# Patient Record
Sex: Male | Born: 1950 | Race: White | Hispanic: No | Marital: Married | Smoking: Current every day smoker
Health system: Southern US, Community
[De-identification: ages and names within clinical notes are randomized; demographics above are authoritative.]

## PROBLEM LIST (undated history)

## (undated) DIAGNOSIS — F17200 Nicotine dependence, unspecified, uncomplicated: Secondary | ICD-10-CM

## (undated) DIAGNOSIS — H269 Unspecified cataract: Secondary | ICD-10-CM

## (undated) DIAGNOSIS — K635 Polyp of colon: Secondary | ICD-10-CM

## (undated) HISTORY — PX: TYMPANOSTOMY TUBE PLACEMENT: SHX32

## (undated) HISTORY — DX: Nicotine dependence, unspecified, uncomplicated: F17.200

## (undated) HISTORY — DX: Unspecified cataract: H26.9

## (undated) HISTORY — PX: TONSILLECTOMY: SHX5217

## (undated) HISTORY — DX: Polyp of colon: K63.5

---

## 2008-09-15 ENCOUNTER — Ambulatory Visit: Payer: Self-pay | Admitting: Family Medicine

## 2008-11-25 DIAGNOSIS — K635 Polyp of colon: Secondary | ICD-10-CM

## 2008-11-25 HISTORY — DX: Polyp of colon: K63.5

## 2008-11-25 HISTORY — PX: COLONOSCOPY: SHX174

## 2011-10-27 ENCOUNTER — Ambulatory Visit (INDEPENDENT_AMBULATORY_CARE_PROVIDER_SITE_OTHER): Payer: 59 | Admitting: Family Medicine

## 2011-10-27 ENCOUNTER — Ambulatory Visit
Admission: RE | Admit: 2011-10-27 | Discharge: 2011-10-27 | Disposition: A | Discharge status: Home / Self Care | Payer: 59 | Source: Ambulatory Visit | Attending: Family Medicine | Admitting: Family Medicine

## 2011-10-27 ENCOUNTER — Encounter: Payer: Self-pay | Admitting: Family Medicine

## 2011-10-27 VITALS — BP 140/70 | HR 68 | Ht 69.0 in | Wt 188.0 lb

## 2011-10-27 DIAGNOSIS — M545 Low back pain: Secondary | ICD-10-CM

## 2011-10-27 LAB — POCT URINALYSIS DIPSTICK
Blood, UA: NEGATIVE
Glucose, UA: NEGATIVE
Nitrite, UA: NEGATIVE
Protein, UA: NEGATIVE
Spec Grav, UA: <=1.005
Urobilinogen, UA: NEGATIVE

## 2011-10-27 NOTE — Progress Notes (Signed)
Chief complaint:  right sided LBP x a few days. Last week he was working outside and tripped over a root and injured his rib. This morning woke up and his backpain had worsened  HPI: Tripped on a tree root last week, and R lower ribs were sore, still occasionally twinges. Didn't have any back pain related to that fall.  Picked up his 63 year old ("solid") grandson about 3 days ago, and began having right sided back pain at that time.  Pain has continued, but it was much worse this morning, he could hardly get dressed.  Hasn't needed to take any meds for the pain, until pain got worse today.  Hasn't taken any meds today. Denies any radiation of pain, numbness, tingling or weakness.  Pain 1/10 when just at rest.  With certain movements, pain gets to 7-8/10.  Past Medical History  Diagnosis Date  . Smoker   . Colon polyps 11/2008    Dr. Helaine Chess adenoma.  repeat rec 3 years    Past Surgical History  Procedure Date  . Tonsillectomy child  . Tympanostomy tube placement child  . Colonoscopy 11/2008    Dr. Bosie Clos    History   Social History  . Marital Status: Married    Spouse Name: N/A    Number of Children: N/A  . Years of Education: N/A   Occupational History  . 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 Smoking status: Current Everyday Smoker -- 1.0 packs/day for 45 years  . Smokeless tobacco: Never Used  . Alcohol Use: Yes     1-2 beers every evening.  . Drug Use: No  . Sexually Active: Not on file   Other Topics Concern  . Not on file   Social History Narrative  . No narrative on file    Family History  Problem Relation Age of Onset  . Diabetes Mother   . Diabetes Father   . Cancer Neg Hx   . Heart disease Neg Hx     Current outpatient prescriptions:Cholecalciferol (VITAMIN D) 2000 UNITS tablet, Take 2,000 Units by mouth daily., Disp: , Rfl: ;  Multiple Vitamins-Minerals (MULTIVITAMIN WITH MINERALS) tablet, Take 1 tablet by mouth  daily., Disp: , Rfl:   No Known Allergies  ROS:  Denies fevers, URI symptoms.  Some mild allergies causing runny nose and occasional cough.  Denies shortness of breath.  Denies nausea, vomiting, diarrhea, abdominal pain, bowel changes, urinary problems. Denies skin rashes/lesions.  PHYSICAL EXAM: BP 140/70  Pulse 68  Ht 5\' 9"  (1.753 m)  Wt 188 lb (85.276 kg)  BMI 27.76 kg/m2 Well developed, pleasant, elderly male (appears older than stated age) in no distress Neck: no mass, no lymphadenopathy, spine nontender Spine--nontender.  No CVA tenderness.  No significant paraspinous muscle spasm. Tender at a focal spot on R inferiormost rib Negative straight leg raise.  Normal strength, sensation.  DTR's symmetric.  Normal gait. Skin without rashes  ASSESSMENT/PLAN:  1. LBP (low back pain)  POCT Urinalysis Dipstick, DG Chest 2 View, DG Ribs Unilateral Right   Back pain--focal area of pain at lower posterior rib (different area than injury last week).  Given that he is a smoker, will check CXR and rib detail to further evaluate area.   Declined Toradol shot.  Prefers treatment with OTC NSAIDs.  Recommended Aleve, up to 2 tabs BID, along with heat/ice.  F/u in 1 week if not better.   Schedule CPE with Dr. Lenord Carbo need  pneumovax. Check insurance for Zostavax coverage

## 2011-10-27 NOTE — Patient Instructions (Addendum)
Check insurance for shingles vaccine coverage (zostavax) prior to your physical appointment. Try and work on quitting smoking  Try heat and/or ice to area of pain.  I recommend using anti-inflammatories.  Since you have Aleve at home, I recommended using 1-2 pills twice daily for pain.  Take it with food.  Don't take ibuprofen or any other pain medications while using the Aleve (tylenol is okay, but no others).  Return in 1 week if pain isn't improving.  Go for your x-rays today to evaluate the ribs and chest

## 2014-09-23 ENCOUNTER — Encounter: Payer: Self-pay | Admitting: Family Medicine

## 2014-09-23 ENCOUNTER — Ambulatory Visit (INDEPENDENT_AMBULATORY_CARE_PROVIDER_SITE_OTHER): Payer: Medicare Other | Admitting: Family Medicine

## 2014-09-23 VITALS — BP 140/70 | HR 70 | Ht 69.0 in | Wt 169.0 lb

## 2014-09-23 DIAGNOSIS — E785 Hyperlipidemia, unspecified: Secondary | ICD-10-CM

## 2014-09-23 DIAGNOSIS — R0609 Other forms of dyspnea: Secondary | ICD-10-CM

## 2014-09-23 DIAGNOSIS — Z9849 Cataract extraction status, unspecified eye: Secondary | ICD-10-CM | POA: Insufficient documentation

## 2014-09-23 DIAGNOSIS — Z86010 Personal history of colonic polyps: Secondary | ICD-10-CM | POA: Diagnosis not present

## 2014-09-23 DIAGNOSIS — H269 Unspecified cataract: Secondary | ICD-10-CM | POA: Diagnosis not present

## 2014-09-23 DIAGNOSIS — Z720 Tobacco use: Secondary | ICD-10-CM

## 2014-09-23 DIAGNOSIS — F17200 Nicotine dependence, unspecified, uncomplicated: Secondary | ICD-10-CM | POA: Insufficient documentation

## 2014-09-23 LAB — COMPREHENSIVE METABOLIC PANEL
ALT: 14 U/L (ref 0–53)
AST: 24 U/L (ref 0–37)
Albumin: 4.1 g/dL (ref 3.5–5.2)
Alkaline Phosphatase: 58 U/L (ref 39–117)
BILIRUBIN TOTAL: 0.5 mg/dL (ref 0.2–1.2)
BUN: 13 mg/dL (ref 6–23)
CO2: 26 mEq/L (ref 19–32)
CREATININE: 0.76 mg/dL (ref 0.50–1.35)
Calcium: 9.2 mg/dL (ref 8.4–10.5)
Chloride: 96 mEq/L (ref 96–112)
Glucose, Bld: 92 mg/dL (ref 70–99)
Potassium: 4.6 mEq/L (ref 3.5–5.3)
SODIUM: 135 meq/L (ref 135–145)
Total Protein: 7.2 g/dL (ref 6.0–8.3)

## 2014-09-23 LAB — LIPID PANEL
CHOL/HDL RATIO: 2.3 ratio
Cholesterol: 203 mg/dL — ABNORMAL HIGH (ref 0–200)
HDL: 87 mg/dL (ref >=40)
LDL Cholesterol: 106 mg/dL — ABNORMAL HIGH (ref 0–99)
TRIGLYCERIDES: 50 mg/dL (ref <150)
VLDL: 10 mg/dL (ref 0–40)

## 2014-09-23 LAB — CBC WITH DIFFERENTIAL/PLATELET
BASOS ABS: 0.1 10*3/uL (ref 0.0–0.1)
Basophils Relative: 1 % (ref 0–1)
EOS ABS: 0.1 10*3/uL (ref 0.0–0.7)
EOS PCT: 1 % (ref 0–5)
HEMATOCRIT: 43.9 % (ref 39.0–52.0)
Hemoglobin: 14.8 g/dL (ref 13.0–17.0)
Lymphocytes Relative: 42 % (ref 12–46)
Lymphs Abs: 4.2 10*3/uL — ABNORMAL HIGH (ref 0.7–4.0)
MCH: 31.6 pg (ref 26.0–34.0)
MCHC: 33.7 g/dL (ref 30.0–36.0)
MCV: 93.8 fL (ref 78.0–100.0)
MONOS PCT: 9 % (ref 3–12)
MPV: 8.8 fL (ref 8.6–12.4)
Monocytes Absolute: 0.9 10*3/uL (ref 0.1–1.0)
NEUTROS ABS: 4.7 10*3/uL (ref 1.7–7.7)
Neutrophils Relative %: 47 % (ref 43–77)
Platelets: 233 10*3/uL (ref 150–400)
RBC: 4.68 MIL/uL (ref 4.22–5.81)
RDW: 14.1 % (ref 11.5–15.5)
WBC: 10 10*3/uL (ref 4.0–10.5)

## 2014-09-23 NOTE — Progress Notes (Signed)
   Subjective:    Patient ID: John AlamoBrian Rollins, male    DOB: 08/25/1950, 64 y.o.   MRN: [id number]  HPI He is here for an IPPE. His medical and social history was reviewed. He does smoke and has no intentions to quit. He keeps himself physically active helping take care of his grandchildren. He has no symptoms of depression. His functional ability is quite good. He does have a living will.he has had cataract surgery on the right and plans to have one on the left. He also has a previous history of colonic polyps and was given a letter to have this repeated.Initially he had no particular concerns or complaints however at the end of the interview he then mentioned a one-year history of dyspnea on exertion. He states that it has not gotten any worse in the last year. He then stated that it is intermittent in nature. He's had no chest pain, PND, peripheral edema.   Review of Systems     Objective:   Physical Exam Alert and in no distress. Tympanic membranes and canals are normal. Pharyngeal area is normal. Neck is supple without adenopathy or thyromegaly. Cardiac exam shows a regular sinus rhythm without murmurs or gallops. Lungs are clear to auscultation.abdominal exam shows no masses or tenderness with normal bowel sounds. EKG shows no acute changes.        Assessment & Plan:  Current smoker - Plan: CBC with Differential/Platelet, Comprehensive metabolic panel, Lipid panel  DOE (dyspnea on exertion) - Plan: EKG 12-Lead, CBC with Differential/Platelet, Comprehensive metabolic panel, Lipid panel  Cataract  History of colonic polyps he will set up an appointment to see his ophthalmologist and gastroenterologist. I will await further blood work and if normal schedule him back for pulmonary function testing reviewed at this time he is not interested in quitting smoking.

## 2014-09-23 NOTE — Patient Instructions (Signed)
Go ahead and set up your appointment to see the gastroenterologist and your ophthalmologist.

## 2015-03-16 ENCOUNTER — Encounter: Payer: Self-pay | Admitting: Family Medicine

## 2016-01-18 ENCOUNTER — Telehealth: Payer: Self-pay | Admitting: Family Medicine

## 2016-01-18 ENCOUNTER — Ambulatory Visit (INDEPENDENT_AMBULATORY_CARE_PROVIDER_SITE_OTHER): Payer: Medicare Other | Admitting: Family Medicine

## 2016-01-18 ENCOUNTER — Encounter: Payer: Self-pay | Admitting: Family Medicine

## 2016-01-18 ENCOUNTER — Ambulatory Visit
Admission: RE | Admit: 2016-01-18 | Discharge: 2016-01-18 | Disposition: A | Discharge status: Home / Self Care | Payer: Medicare Other | Source: Ambulatory Visit | Attending: Family Medicine | Admitting: Family Medicine

## 2016-01-18 VITALS — BP 130/80 | HR 78 | Temp 98.2°F | Ht 69.0 in | Wt 166.0 lb

## 2016-01-18 DIAGNOSIS — R0609 Other forms of dyspnea: Secondary | ICD-10-CM

## 2016-01-18 DIAGNOSIS — J209 Acute bronchitis, unspecified: Secondary | ICD-10-CM

## 2016-01-18 DIAGNOSIS — R058 Other specified cough: Secondary | ICD-10-CM

## 2016-01-18 DIAGNOSIS — Z720 Tobacco use: Secondary | ICD-10-CM

## 2016-01-18 DIAGNOSIS — R05 Cough: Secondary | ICD-10-CM

## 2016-01-18 DIAGNOSIS — F17200 Nicotine dependence, unspecified, uncomplicated: Secondary | ICD-10-CM

## 2016-01-18 MED ORDER — AMOXICILLIN-POT CLAVULANATE 875-125 MG PO TABS: 1.0000 | ORAL_TABLET | Freq: Two times a day (BID) | ORAL | 0 refills | Status: DC

## 2016-01-18 NOTE — Progress Notes (Signed)
   Subjective:    Patient ID: John Rollins, male    DOB: Sep 04, 1950, 64 y.o.   MRN: [id number]  HPI He complains of a two month history of increasing difficulty with productive cough and shortness of breath with the coughing. No fever, chills, chest pain, earache or sore throat. He does have underlying chronic rhinorrhea with PND and slight cough. He has a 50-pack-year smoking history and is not interested in quitting.   Review of Systems     Objective:   Physical Exam Alert and in no distress. Tympanic membranes and canals are normal. Pharyngeal area is normal. Neck is supple without adenopathy or thyromegaly. Cardiac exam shows a regular sinus rhythm without murmurs or gallops. Lungs show harsh breath sounds in the right lower base.      Assessment & Plan:  Current smoker - Plan: DG Chest 2 View  DOE (dyspnea on exertion) - Plan: DG Chest 2 View  Productive cough - Plan: DG Chest 2 View, amoxicillin-clavulanate (AUGMENTIN) 875-125 MG tablet  Acute bronchitis, unspecified organism Chest x-ray ordered. Also discussed possible follow-up limited chest CT. The x-ray shows no evidence of pneumonia. I will call in an antibiotic and recommend he follow-up with me in a month.

## 2016-03-22 ENCOUNTER — Ambulatory Visit (INDEPENDENT_AMBULATORY_CARE_PROVIDER_SITE_OTHER): Payer: Medicare Other | Admitting: Family Medicine

## 2016-03-22 ENCOUNTER — Encounter: Payer: Self-pay | Admitting: Family Medicine

## 2016-03-22 VITALS — BP 152/78 | HR 84 | Temp 98.0°F | Ht 69.0 in | Wt 170.2 lb

## 2016-03-22 DIAGNOSIS — F17200 Nicotine dependence, unspecified, uncomplicated: Secondary | ICD-10-CM

## 2016-03-22 DIAGNOSIS — Z720 Tobacco use: Secondary | ICD-10-CM

## 2016-03-22 DIAGNOSIS — J209 Acute bronchitis, unspecified: Secondary | ICD-10-CM

## 2016-03-22 MED ORDER — LEVOFLOXACIN 500 MG PO TABS: 500.0000 mg | ORAL_TABLET | Freq: Every day | ORAL | 0 refills | Status: DC

## 2016-03-22 NOTE — Progress Notes (Signed)
   Subjective:    Patient ID: John Rollins, male    DOB: 29-Apr-1951, 65 y.o.   MRN: [id number]  HPI He is here for consult concerning continued difficulty with cough and drainage. He states that the cough usually occurs after he notes some postnasal drainage causing him to cough and become more short of breath. He does however continue to smoke. No fever, chills, sore throat, earache, fatigue or malaise.   Review of Systems     Objective:   Physical Exam Alert and in no distress. Tympanic membranes and canals are normal. Pharyngeal area is normal. Neck is supple without adenopathy or thyromegaly. Cardiac exam shows a regular sinus rhythm without murmurs or gallops. Lungs are clear to auscultation. X-ray was reviewed.       Assessment & Plan:  Current smoker  Acute bronchitis, unspecified organism - Plan: levofloxacin (LEVAQUIN) 500 MG tablet Hard to say whether this is a bronchitis or sinusitis or combination thereof. I will treat him with Levaquin and have her return here 2 weeks for recheck and also PFT.

## 2016-04-06 ENCOUNTER — Ambulatory Visit (INDEPENDENT_AMBULATORY_CARE_PROVIDER_SITE_OTHER): Payer: Medicare Other | Admitting: Family Medicine

## 2016-04-06 ENCOUNTER — Encounter: Payer: Self-pay | Admitting: Family Medicine

## 2016-04-06 VITALS — BP 112/76 | HR 85 | Wt 169.0 lb

## 2016-04-06 DIAGNOSIS — Z122 Encounter for screening for malignant neoplasm of respiratory organs: Secondary | ICD-10-CM

## 2016-04-06 DIAGNOSIS — J449 Chronic obstructive pulmonary disease, unspecified: Secondary | ICD-10-CM | POA: Diagnosis not present

## 2016-04-06 DIAGNOSIS — F17200 Nicotine dependence, unspecified, uncomplicated: Secondary | ICD-10-CM | POA: Diagnosis not present

## 2016-04-06 NOTE — Progress Notes (Signed)
   Subjective:    Patient ID: Sonnie AlamoBrian Frommer, male    DOB: [birth year], 65 y.o.   MRN: [id number]  HPI He is here for follow-up after recent bout of bronchitis. He now states that he is back to his normal state of health up only having occasional difficulty with coughing. He has a 50+ pack year history of smoking. He continues to smoke and at this point is not interested in quitting.Marland Kitchen. He has had no difficulty with weight change, or hemoptysis, previous history of lung cancer. He is willing to have further biopsies/surgery if needed.   Review of Systems     Objective:   Physical Exam Alert and in no distress. Lungs are clear to auscultation. Pulmonary function testing today showed very severe obstruction.       Assessment & Plan:  Smoker - Plan: Spirometry with graph  Current smoker  Chronic obstructive pulmonary disease, unspecified COPD type (HCC)  Screening for malignant neoplasm of respiratory organ I discussed the CT scan with him and the criteria for this. He is willing to go forward with this. We will order the scan and refer him to pulmonary since he does have her obstruction. I again stressed the need for him to quit smoking and again he at this point is not interested. I

## 2016-05-13 ENCOUNTER — Other Ambulatory Visit: Payer: Self-pay | Admitting: Family Medicine

## 2016-05-13 DIAGNOSIS — Z122 Encounter for screening for malignant neoplasm of respiratory organs: Secondary | ICD-10-CM

## 2016-05-13 DIAGNOSIS — F17200 Nicotine dependence, unspecified, uncomplicated: Secondary | ICD-10-CM

## 2016-05-17 ENCOUNTER — Ambulatory Visit
Admission: RE | Admit: 2016-05-17 | Discharge: 2016-05-17 | Disposition: A | Discharge status: Home / Self Care | Payer: Medicare Other | Source: Ambulatory Visit | Attending: Family Medicine | Admitting: Family Medicine

## 2016-05-17 DIAGNOSIS — Z122 Encounter for screening for malignant neoplasm of respiratory organs: Secondary | ICD-10-CM

## 2016-05-17 DIAGNOSIS — F17200 Nicotine dependence, unspecified, uncomplicated: Secondary | ICD-10-CM

## 2016-05-18 ENCOUNTER — Other Ambulatory Visit: Payer: Self-pay

## 2016-05-18 DIAGNOSIS — I2510 Atherosclerotic heart disease of native coronary artery without angina pectoris: Secondary | ICD-10-CM

## 2018-03-06 ENCOUNTER — Encounter: Payer: Self-pay | Admitting: Family Medicine

## 2018-03-06 ENCOUNTER — Ambulatory Visit: Payer: Medicare Other | Admitting: Family Medicine

## 2018-03-06 VITALS — BP 138/82 | HR 72 | Temp 97.6°F | Ht 69.0 in | Wt 172.8 lb

## 2018-03-06 DIAGNOSIS — F17200 Nicotine dependence, unspecified, uncomplicated: Secondary | ICD-10-CM

## 2018-03-06 DIAGNOSIS — Z0000 Encounter for general adult medical examination without abnormal findings: Secondary | ICD-10-CM | POA: Diagnosis not present

## 2018-03-06 DIAGNOSIS — Z13220 Encounter for screening for lipoid disorders: Secondary | ICD-10-CM

## 2018-03-06 DIAGNOSIS — R030 Elevated blood-pressure reading, without diagnosis of hypertension: Secondary | ICD-10-CM

## 2018-03-06 DIAGNOSIS — I700 Atherosclerosis of aorta: Secondary | ICD-10-CM

## 2018-03-06 DIAGNOSIS — Z860100 Personal history of colon polyps, unspecified: Secondary | ICD-10-CM

## 2018-03-06 DIAGNOSIS — Z136 Encounter for screening for cardiovascular disorders: Secondary | ICD-10-CM

## 2018-03-06 DIAGNOSIS — J439 Emphysema, unspecified: Secondary | ICD-10-CM | POA: Diagnosis not present

## 2018-03-06 DIAGNOSIS — Z1159 Encounter for screening for other viral diseases: Secondary | ICD-10-CM | POA: Diagnosis not present

## 2018-03-06 LAB — POCT URINALYSIS DIP (PROADVANTAGE DEVICE)
BILIRUBIN UA: NEGATIVE mg/dL
Bilirubin, UA: NEGATIVE
Glucose, UA: NEGATIVE mg/dL
Leukocytes, UA: NEGATIVE
Nitrite, UA: NEGATIVE
PROTEIN UA: NEGATIVE mg/dL
RBC UA: NEGATIVE
SPECIFIC GRAVITY, URINE: 1.01
UUROB: 3.5
pH, UA: 6 (ref 5.0–8.0)

## 2018-03-06 NOTE — Addendum Note (Signed)
Addended by: Renelda Loma on: 03/06/2018 11:18 AM   Modules accepted: Orders

## 2018-03-06 NOTE — Progress Notes (Signed)
John Rollins is a 67 y.o. male who presents for annual wellness visit and follow-up on chronic medical conditions.  He has no particular concerns or complaints.  He does continue to smoke and is not interested in quitting.  He did have a CT scan done in 2017 but is not interested in repeating that.  Scan did show evidence of emphysema as well as atherosclerosis.  He also has a history of colonic polyps but did not follow-up in the appropriate timeframe.  He has cut down on his alcohol consumption.  He is now drinking roughly 1 or 2/day.  He is retired and enjoying his retirement.  Spending time with his grandchildren.   Immunizations and Health Maintenance Immunization History  Administered Date(s) Administered  . Influenza-Unspecified 03/27/2014, 04/04/2016  . Tdap 06/28/2007   Health Maintenance Due  Topic Date Due  . Hepatitis C Screening  06-Nov-1950  . COLONOSCOPY  12/23/1998  . PNA vac Low Risk Adult (1 of 2 - PCV13) 12/22/2013  . TETANUS/TDAP  06/27/2017  . INFLUENZA VACCINE  01/25/2018    Last colonoscopy:over five years Last PSA: over one year Dentist: over two years Ophtho: three years ago Exercise: walking, yard work  Other doctors caring for patient include:none Advanced Directives: Not discussed    Depression screen:  See questionnaire below.     Depression screen [facility] 2/9 03/06/2018 01/18/2016 09/23/2014  Decreased Interest 0 0 0  Down, Depressed, Hopeless 0 0 0  PHQ - 2 Score 0 0 0    Fall Screen: See Questionaire below.   Fall Risk  03/06/2018 01/18/2016 09/23/2014  Falls in the past year? No No No    ADL screen:  See questionnaire below.  Functional Status Survey: Is the patient deaf or have difficulty hearing?: No Does the patient have difficulty seeing, even when wearing glasses/contacts?: No Does the patient have difficulty concentrating, remembering, or making decisions?: No Does the patient have difficulty walking or climbing stairs?: Yes(SOB on  stairs) Does the patient have difficulty dressing or bathing?: No Does the patient have difficulty doing errands alone such as visiting a doctor's office or shopping?: No   Review of Systems  Constitutional: -, -unexpected weight change, -anorexia, -fatigue Allergy: -sneezing, -itching, -congestion Dermatology: denies changing moles, rash, lumps ENT: -runny nose, -ear pain, -sore throat,  Cardiology:  -chest pain, -palpitations, -orthopnea, Respiratory: -cough, -shortness of breath, -dyspnea on exertion, -wheezing,  Gastroenterology: -abdominal pain, -nausea, -vomiting, -diarrhea, -constipation, -dysphagia Hematology: -bleeding or bruising problems Musculoskeletal: -arthralgias, -myalgias, -joint swelling, -back pain, - Ophthalmology: -vision changes,  Urology: -dysuria, -difficulty urinating,  -urinary frequency, -urgency, incontinence Neurology: -, -numbness, , -memory loss, -falls, -dizziness    PHYSICAL EXAM:   General Appearance: Alert, cooperative, no distress, appears stated age Head: Normocephalic, without obvious abnormality, atraumatic Eyes: PERRL, conjunctiva/corneas clear, EOM's intact, fundi benign Ears: Normal TM's and external ear canals Nose: Nares normal, mucosa normal, no drainage or sinus   tenderness Throat: Lips, mucosa, and tongue normal; teeth and gums normal Neck: Supple, no lymphadenopathy, thyroid:no enlargement/tenderness/nodules; no carotid bruit or JVD Lungs: Clear to auscultation bilaterally without wheezes, rales or ronchi; respirations unlabored Heart: Regular rate and rhythm, S1 and S2 normal, no murmur, rub or gallop Abdomen: Soft, non-tender, nondistended, normoactive bowel sounds, no masses, no hepatosplenomegaly Extremities: No clubbing, cyanosis or edema Pulses: 2+ and symmetric all extremities Skin: Skin color, texture, turgor normal, no rashes or lesions Lymph nodes: Cervical, supraclavicular, and axillary nodes normal Neurologic:  CNII-XII intact, normal strength, sensation and  gait; reflexes 2+ and symmetric throughout   Psych: Normal mood, affect, hygiene and grooming  ASSESSMENT/PLAN: Smoker - Plan: CBC with Differential/Platelet, Comprehensive metabolic panel  Pulmonary emphysema, unspecified emphysema type (HCC)  Atherosclerosis of aorta (HCC) - Plan: Lipid panel  Encounter for hepatitis C screening test for low risk patient - Plan: Hepatitis C antibody  Screening for lipid disorders - Plan: Lipid panel  History of colonic polyps - Plan: CBC with Differential/Platelet, Comprehensive metabolic panel, Ambulatory referral to Gastroenterology  Screening for AAA (abdominal aortic aneurysm) - Plan: US Abdomen Limited, CBC with Differential/Platelet, Comprehensive metabolic panel  Elevated blood pressure reading He is to return here in 2 months for repeat blood pressure check. Recommend getting Tdap, Shingrix and flu shot at the drugstore.   , recommended at least 30 minutes of aerobic activity at least 5 days/week;  healthy diet and alcohol recommendations (less than or equal to 2 drinks/day) reviewed; Marland Kitchen Immunization recommendations discussed.  Colonoscopy recommendations reviewed.   Medicare Attestation I have personally reviewed: The patient's medical and social history Their use of alcohol, tobacco or illicit drugs Their current medications and supplements The patient's functional ability including ADLs,fall risks, home safety risks, cognitive, and hearing and visual impairment Diet and physical activities Evidence for depression or mood disorders  The patient's weight, height, and BMI have been recorded in the chart.  I have made referrals, counseling, and provided education to the patient based on review of the above and I have provided the patient with a written personalized care plan for preventive services.     Sharlot Gowda, MD   03/06/2018

## 2018-03-06 NOTE — Addendum Note (Signed)
Addended by: Renelda Loma on: 03/06/2018 10:49 AM   Modules accepted: Orders

## 2018-03-07 LAB — CBC WITH DIFFERENTIAL/PLATELET
Basophils Absolute: 0.1 10*3/uL (ref 0.0–0.2)
Basos: 1 %
EOS (ABSOLUTE): 0.1 10*3/uL (ref 0.0–0.4)
EOS: 1 %
HEMATOCRIT: 46.2 % (ref 37.5–51.0)
HEMOGLOBIN: 15.7 g/dL (ref 13.0–17.7)
IMMATURE GRANS (ABS): 0 10*3/uL (ref 0.0–0.1)
Immature Granulocytes: 0 %
LYMPHS: 40 %
Lymphocytes Absolute: 3.2 10*3/uL — ABNORMAL HIGH (ref 0.7–3.1)
MCH: 32.1 pg (ref 26.6–33.0)
MCHC: 34 g/dL (ref 31.5–35.7)
MCV: 95 fL (ref 79–97)
Monocytes Absolute: 0.5 10*3/uL (ref 0.1–0.9)
Monocytes: 6 %
NEUTROS ABS: 4.2 10*3/uL (ref 1.4–7.0)
Neutrophils: 52 %
Platelets: 236 10*3/uL (ref 150–450)
RBC: 4.89 x10E6/uL (ref 4.14–5.80)
RDW: 14.1 % (ref 12.3–15.4)
WBC: 8.1 10*3/uL (ref 3.4–10.8)

## 2018-03-07 LAB — LIPID PANEL
CHOL/HDL RATIO: 2.4 ratio (ref 0.0–5.0)
Cholesterol, Total: 197 mg/dL (ref 100–199)
HDL: 82 mg/dL (ref >39)
LDL CALC: 92 mg/dL (ref 0–99)
Triglycerides: 115 mg/dL (ref 0–149)
VLDL CHOLESTEROL CAL: 23 mg/dL (ref 5–40)

## 2018-03-07 LAB — COMPREHENSIVE METABOLIC PANEL
ALBUMIN: 4.5 g/dL (ref 3.6–4.8)
ALT: 16 IU/L (ref 0–44)
AST: 24 IU/L (ref 0–40)
Albumin/Globulin Ratio: 1.6 (ref 1.2–2.2)
Alkaline Phosphatase: 68 IU/L (ref 39–117)
BUN / CREAT RATIO: 8 — AB (ref 10–24)
BUN: 7 mg/dL — AB (ref 8–27)
Bilirubin Total: 0.4 mg/dL (ref 0.0–1.2)
CO2: 24 mmol/L (ref 20–29)
CREATININE: 0.83 mg/dL (ref 0.76–1.27)
Calcium: 9.6 mg/dL (ref 8.6–10.2)
Chloride: 96 mmol/L (ref 96–106)
GFR, EST AFRICAN AMERICAN: 104 mL/min/{1.73_m2} (ref >59)
GFR, EST NON AFRICAN AMERICAN: 90 mL/min/{1.73_m2} (ref >59)
GLOBULIN, TOTAL: 2.8 g/dL (ref 1.5–4.5)
Glucose: 94 mg/dL (ref 65–99)
Potassium: 4.7 mmol/L (ref 3.5–5.2)
SODIUM: 134 mmol/L (ref 134–144)
TOTAL PROTEIN: 7.3 g/dL (ref 6.0–8.5)

## 2018-03-07 LAB — HEPATITIS C ANTIBODY: Hep C Virus Ab: <0.1 s/co ratio (ref 0.0–0.9)

## 2018-03-13 ENCOUNTER — Ambulatory Visit
Admission: RE | Admit: 2018-03-13 | Discharge: 2018-03-13 | Disposition: A | Discharge status: Home / Self Care | Payer: Medicare Other | Source: Ambulatory Visit | Attending: Family Medicine | Admitting: Family Medicine

## 2018-05-08 ENCOUNTER — Ambulatory Visit: Payer: Medicare Other | Admitting: Family Medicine

## 2018-05-09 ENCOUNTER — Encounter: Payer: Self-pay | Admitting: Family Medicine

## 2018-05-09 ENCOUNTER — Ambulatory Visit: Payer: Medicare Other | Admitting: Family Medicine

## 2018-05-09 VITALS — BP 122/80 | HR 86 | Temp 97.7°F | Wt 177.4 lb

## 2018-05-09 DIAGNOSIS — J439 Emphysema, unspecified: Secondary | ICD-10-CM | POA: Diagnosis not present

## 2018-05-09 DIAGNOSIS — R942 Abnormal results of pulmonary function studies: Secondary | ICD-10-CM

## 2018-05-09 DIAGNOSIS — Z91199 Patient's noncompliance with other medical treatment and regimen due to unspecified reason: Secondary | ICD-10-CM

## 2018-05-09 DIAGNOSIS — Z9119 Patient's noncompliance with other medical treatment and regimen: Secondary | ICD-10-CM | POA: Diagnosis not present

## 2018-05-09 DIAGNOSIS — F17200 Nicotine dependence, unspecified, uncomplicated: Secondary | ICD-10-CM

## 2018-05-09 NOTE — Progress Notes (Signed)
   Subjective:    Patient ID: John Rollins, male    DOB: Jul 07, 1950, 67 y.o.   MRN: [id number]  HPI He is here for recheck on his blood pressure.  He then mentioned the possibility of getting a handicap sticker.  He states that he sometimes has difficulty with becoming short of breath with any significant walking.  He continues to smoke and has no interest in quitting.   Review of Systems     Objective:   Physical Exam Alert and in no distress.  Blood pressure is normal.  Pulse ox on room air was adequate and with walking 94.  Spirometry did show severe airway obstruction.       Assessment & Plan:  Abnormal PFT - Plan: Ambulatory referral to Pulmonology  Pulmonary emphysema, unspecified emphysema type (HCC) - Plan: Spirometry with graph, CANCELED: Spirometry with graph  Smoker - Plan: Ambulatory referral to Pulmonology I explained that now we are actually seeing lung damage from his smoking I again discussed the need for him to quit smoking.  I explained that I will refer him to pulmonary to decide which medication would be the most appropriate place him on.

## 2018-06-07 ENCOUNTER — Encounter: Payer: Self-pay | Admitting: Pulmonary Disease

## 2018-06-07 ENCOUNTER — Ambulatory Visit (INDEPENDENT_AMBULATORY_CARE_PROVIDER_SITE_OTHER): Payer: Medicare Other | Admitting: Pulmonary Disease

## 2018-06-07 VITALS — BP 140/70 | HR 67 | Ht 67.0 in | Wt 177.8 lb

## 2018-06-07 DIAGNOSIS — J449 Chronic obstructive pulmonary disease, unspecified: Secondary | ICD-10-CM

## 2018-06-07 MED ORDER — TIOTROPIUM BROMIDE-OLODATEROL 2.5-2.5 MCG/ACT IN AERS: 2.0000 | INHALATION_SPRAY | Freq: Every day | RESPIRATORY_TRACT | 3 refills | Status: DC

## 2018-06-07 MED ORDER — ALBUTEROL SULFATE HFA 108 (90 BASE) MCG/ACT IN AERS: 2.0000 | INHALATION_SPRAY | RESPIRATORY_TRACT | 6 refills | Status: DC | PRN

## 2018-06-07 NOTE — Patient Instructions (Addendum)
#  Chronic obstructive pulmonary disease (COPD), uncontrolled --START Stiolto 2 puffs daily --START Albuterol inhaler 2 puffs every 4 hours as needed for shortness of breath or wheezing --ORDER full pulmonary function tests prior to next visit  #Tobacco abuse --STOP smoking --REFER to lung cancer screening clinic based on significant tobacco history

## 2018-06-07 NOTE — Progress Notes (Signed)
Synopsis: Referred in 05/2018 for COPD  Subjective:   PATIENT ID: John Rollins, John Rollins   HPI  Chief Complaint  Patient presents with  . consult    SOB worsening over past 5 years.  Prod cough on and off - yellow to white   [name] is a 67 year old male current smoker with severe COPD who presents as a new consult for shortness of breath.  He reports gradually worsening history of shortness of breath the last 5 years.  He has a significant smoking history of greater than 50 to 60 pack years and still smokes 1 pack/day.  Shortness of breath is associated with productive cough with clear/yellow sputum.  Occasional wheezing.  He has symptoms with walking to the mailbox and going up a flight of stairs.  He has not tried anything to improve his symptoms except rest.  Denies fevers, chills, weight loss, night sweats.  Denies chest pain, palpitations.  Environmental exposures: [place], exposure to agent orange  I have personally reviewed patient's past medical/family/social history, allergies, current medications.  Past Medical History:  Diagnosis Date  . Cataract   . Colon polyps 11/2008   Dr. Helaine ChessSchooler--tubular adenoma.  repeat rec 3 years  . Smoker      Family History  Problem Relation Age of Onset  . Diabetes Mother   . Diabetes Father   . Cancer Neg Hx   . Heart disease Neg Hx      Social History   Occupational History  . 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 Smoking status: Current Every Day Smoker    Packs/day: 1.00    Years: 45.00    Pack years: 45.00  . Smokeless tobacco: Never Used  Substance and Sexual Activity  . Alcohol use: Yes    Comment: 1-2 beers every evening.  . Drug use: No  . Sexual activity: Yes    No Known Allergies   No outpatient medications prior to visit.   No facility-administered medications prior to visit.     Review of Systems    Constitutional: Positive for fever and malaise/fatigue. Negative for chills, diaphoresis and weight loss.  HENT: Positive for congestion. Negative for sinus pain and sore throat.   Respiratory: Positive for cough, sputum production, shortness of breath and wheezing. Negative for hemoptysis.   Cardiovascular: Negative for chest pain, palpitations, leg swelling and PND.  Gastrointestinal: Negative for heartburn.  Musculoskeletal: Negative for myalgias.  Skin: Negative for rash.  Neurological: Negative for dizziness and weakness.  Endo/Heme/Allergies: Negative for environmental allergies.     Objective:  Physical Exam   Vitals:   06/07/18 1017  BP: 140/70  Pulse: 67  SpO2: 97%  Weight: 177 lb 12.8 oz (80.6 kg)  Height: 5\' 7"  (1.702 m)   SpO2: 97 % O2 Device: None (Room air)  Physical Exam: General: Well-appearing, no acute distress HENT: [place], AT Eyes: EOMI, no scleral icterus Respiratory: Decreased breath sounds bilaterally clear to auscultation bilaterally.  No crackles, wheezing or rales Cardiovascular: RRR, -M/R/G, no JVD GI: BS+, soft, nontender Extremities:-Edema,-tenderness Neuro: AAO x4, CNII-XII grossly intact Skin: Intact, no rashes or bruising Psych: Normal mood, normal affect   Chest imaging:  CT chest lung screen 05/17/2016-emphysema, scattered pulmonary nodules bilaterally less than 6 mm  PFT 05/09/2018  FVC 2.1 (48%) FEV1 0.9 (30%) FEV1/FVC 46%  I have personally reviewed the above labs, images and tests noted above.  Assessment & Plan:   Discussion: 67 year old male with significant tobacco history use (60 pack years) who presents for management of COPD.  #Chronic obstructive pulmonary disease (COPD), uncontrolled Gold class B. No exacerbations in the last 12 months. --START Stiolto 2 puffs daily --START Albuterol inhaler 2 puffs every 4 hours as needed for shortness of breath or wheezing --ORDER full pulmonary function tests prior to next  visit  #Tobacco abuse Patient is an active smoker.  Last CT lung screen on 04/2016. --We discussed smoking cessation for 5 minutes. We discussed triggers and stressors and ways to deal with them. We discussed barriers to continued smoking and benefits of smoking cessation. Provided patient with information cessation techniques and interventions including [place] quitline. --REFER to lung cancer screening clinic based on significant tobacco history  Orders Placed This Encounter  Procedures  . Ambulatory Referral for Lung Cancer Scre    Referral Priority:   Routine    Referral Type:   Consultation    Referral Reason:   Specialty Services Required    Number of Visits Requested:   1  . Pulmonary function test    Standing Status:   Future    Standing Expiration Date:   06/08/2019    Scheduling Instructions:     pft with next OV in 1 month    Order Specific Question:   Where should this test be performed?    Answer:   [facility]    Order Specific Question:   Full PFT: includes the following: basic spirometry, spirometry pre & post bronchodilator, diffusion capacity (DLCO), lung volumes    Answer:   Full PFT   Meds ordered this encounter  Medications  . Tiotropium Bromide-Olodaterol (STIOLTO RESPIMAT) 2.5-2.5 MCG/ACT AERS    Sig: Inhale 2 puffs into the lungs daily.    Dispense:  1 Inhaler    Refill:  3  . albuterol (PROVENTIL HFA;VENTOLIN HFA) 108 (90 Base) MCG/ACT inhaler    Sig: Inhale 2 puffs into the lungs every 4 (four) hours as needed for wheezing or shortness of breath.    Dispense:  1 Inhaler    Refill:  6    Return in about 1 month (around 07/08/2018).   Thank you for choosing [facility] for your health needs!   Ferdie Bakken Mechele Collin, MD [facility] 06/07/2018 9:34 PM  Personal pager: [phone] If unanswered, please page [facility] On-call: #[phone]

## 2018-06-11 ENCOUNTER — Other Ambulatory Visit: Payer: Self-pay | Admitting: Acute Care

## 2018-06-11 DIAGNOSIS — Z122 Encounter for screening for malignant neoplasm of respiratory organs: Secondary | ICD-10-CM

## 2018-06-11 DIAGNOSIS — Z87891 Personal history of nicotine dependence: Secondary | ICD-10-CM

## 2018-06-11 DIAGNOSIS — F17210 Nicotine dependence, cigarettes, uncomplicated: Principal | ICD-10-CM

## 2018-07-03 NOTE — Progress Notes (Signed)
Shared Decision Making Visit Lung Cancer Screening Program [phone])   Eligibility:  Age 68 y.o.  Pack Years Smoking History Calculation 82.5 pack year smoking history (# packs/per year x # years smoked)  Recent History of coughing up blood  no  Unexplained weight loss? no ( >Than 15 pounds within the last 6 months )  Prior History Lung / other cancer no (Diagnosis within the last 5 years already requiring surveillance chest CT Scans).  Smoking Status Current Smoker  Former Smokers: Years since quit: NA  Quit Date: NA  Visit Components:  Discussion included one or more decision making aids. yes  Discussion included risk/benefits of screening. yes  Discussion included potential follow up diagnostic testing for abnormal scans. yes  Discussion included meaning and risk of over diagnosis. yes  Discussion included meaning and risk of False Positives. yes  Discussion included meaning of total radiation exposure. yes  Counseling Included:  Importance of adherence to annual lung cancer LDCT screening. yes  Impact of comorbidities on ability to participate in the program. yes  Ability and willingness to under diagnostic treatment. yes  Smoking Cessation Counseling:  Current Smokers:   Discussed importance of smoking cessation. yes  Information about tobacco cessation classes and interventions provided to patient. yes  Patient provided with "ticket" for LDCT Scan. yes  Symptomatic Patient. no  Counseling  Diagnosis Code: Tobacco Use Z72.0  Asymptomatic Patient yes  Counseling (Intermediate counseling: > three minutes counseling) Y7829  Former Smokers:   Discussed the importance of maintaining cigarette abstinence. yes  Diagnosis Code: Personal History of Nicotine Dependence. F62.130  Information about tobacco cessation classes and interventions provided to patient. Yes  Patient provided with "ticket" for LDCT Scan. yes  Written Order for Lung Cancer  Screening with LDCT placed in Epic. Yes (CT Chest Lung Cancer Screening Low Dose W/O CM) [id number] Z12.2-Screening of respiratory organs Z87.891-Personal history of nicotine dependence  I have spent 25 minutes of face to face time with Mr. Skov discussing the risks and benefits of lung cancer screening. We viewed a power point together that explained in detail the above noted topics. We paused at intervals to allow for questions to be asked and answered to ensure understanding.We discussed that the single most powerful action that he can take to decrease his risk of developing lung cancer is to quit smoking. We discussed whether or not he is ready to commit to setting a quit date. We discussed options for tools to aid in quitting smoking including nicotine replacement therapy, non-nicotine medications, support groups, Quit Smart classes, and behavior modification. We discussed that often times setting smaller, more achievable goals, such as eliminating 1 cigarette a day for a week and then 2 cigarettes a day for a week can be helpful in slowly decreasing the number of cigarettes smoked. This allows for a sense of accomplishment as well as providing a clinical benefit. I gave him the " Be Stronger Than Your Excuses" card with contact information for community resources, classes, free nicotine replacement therapy, and access to mobile apps, text messaging, and on-line smoking cessation help. I have also given him my card and contact information in the event he needs to contact me. We discussed the time and location of the scan, and that either Abigail Miyamoto RN or I will call with the results within 24-48 hours of receiving them. I have offered him  a copy of the power point we viewed  as a resource in the event they need reinforcement of  the concepts we discussed today in the office. The patient verbalized understanding of all of  the above and had no further questions upon leaving the office. They have my contact  information in the event they have any further questions.  I spent 4 minutes counseling on smoking cessation and the health risks of continued tobacco abuse.  I explained to the patient that there has been a high incidence of coronary artery disease noted on these exams. I explained that this is a non-gated exam therefore degree or severity cannot be determined. This patient is not currently on statin therapy. I have asked the patient to follow-up with their PCP regarding any incidental finding of coronary artery disease and management with diet or medication as their PCP  feels is clinically indicated. The patient verbalized understanding of the above and had no further questions upon completion of the visit.      Bevelyn NgoSarah F , NP 07/04/2018 12:40 PM

## 2018-07-04 ENCOUNTER — Encounter: Payer: Self-pay | Admitting: Acute Care

## 2018-07-04 ENCOUNTER — Ambulatory Visit (INDEPENDENT_AMBULATORY_CARE_PROVIDER_SITE_OTHER)
Admission: RE | Admit: 2018-07-04 | Discharge: 2018-07-04 | Disposition: A | Discharge status: Home / Self Care | Payer: Medicare Other | Source: Ambulatory Visit | Attending: Acute Care | Admitting: Acute Care

## 2018-07-04 ENCOUNTER — Ambulatory Visit (INDEPENDENT_AMBULATORY_CARE_PROVIDER_SITE_OTHER): Payer: Medicare Other | Admitting: Acute Care

## 2018-07-04 VITALS — BP 168/80 | HR 74 | Ht 67.0 in | Wt 185.0 lb

## 2018-07-04 DIAGNOSIS — F17210 Nicotine dependence, cigarettes, uncomplicated: Secondary | ICD-10-CM

## 2018-07-04 DIAGNOSIS — Z122 Encounter for screening for malignant neoplasm of respiratory organs: Secondary | ICD-10-CM

## 2018-07-04 DIAGNOSIS — Z87891 Personal history of nicotine dependence: Secondary | ICD-10-CM | POA: Diagnosis not present

## 2018-07-06 ENCOUNTER — Other Ambulatory Visit: Payer: Self-pay | Admitting: Acute Care

## 2018-07-06 DIAGNOSIS — Z87891 Personal history of nicotine dependence: Secondary | ICD-10-CM

## 2018-07-06 DIAGNOSIS — F17210 Nicotine dependence, cigarettes, uncomplicated: Principal | ICD-10-CM

## 2018-07-06 DIAGNOSIS — Z122 Encounter for screening for malignant neoplasm of respiratory organs: Secondary | ICD-10-CM

## 2018-07-12 ENCOUNTER — Ambulatory Visit (INDEPENDENT_AMBULATORY_CARE_PROVIDER_SITE_OTHER): Payer: Medicare Other | Admitting: Pulmonary Disease

## 2018-07-12 ENCOUNTER — Ambulatory Visit: Payer: Medicare Other | Admitting: Pulmonary Disease

## 2018-07-12 ENCOUNTER — Encounter: Payer: Self-pay | Admitting: Pulmonary Disease

## 2018-07-12 VITALS — BP 126/64 | HR 64 | Ht 67.0 in | Wt 185.8 lb

## 2018-07-12 DIAGNOSIS — J449 Chronic obstructive pulmonary disease, unspecified: Secondary | ICD-10-CM

## 2018-07-12 LAB — PULMONARY FUNCTION TEST
DL/VA % pred: 46 %
DL/VA: 2.07 ml/min/mmHg/L
DLCO UNC: 10.68 ml/min/mmHg
DLCO unc % pred: 37 %
FEF 25-75 PRE: 0.56 L/s
FEF 25-75 Post: 0.74 L/sec
FEF2575-%CHANGE-POST: 33 %
FEF2575-%PRED-POST: 32 %
FEF2575-%Pred-Pre: 24 %
FEV1-%CHANGE-POST: 11 %
FEV1-%PRED-POST: 51 %
FEV1-%Pred-Pre: 46 %
FEV1-POST: 1.5 L
FEV1-Pre: 1.35 L
FEV1FVC-%Change-Post: 6 %
FEV1FVC-%Pred-Pre: 59 %
FEV6-%CHANGE-POST: 5 %
FEV6-%PRED-POST: 83 %
FEV6-%PRED-PRE: 78 %
FEV6-PRE: 2.95 L
FEV6-Post: 3.12 L
FEV6FVC-%CHANGE-POST: 1 %
FEV6FVC-%PRED-PRE: 101 %
FEV6FVC-%Pred-Post: 103 %
FVC-%Change-Post: 4 %
FVC-%PRED-POST: 80 %
FVC-%Pred-Pre: 77 %
FVC-Post: 3.21 L
FVC-Pre: 3.08 L
POST FEV1/FVC RATIO: 47 %
POST FEV6/FVC RATIO: 97 %
PRE FEV6/FVC RATIO: 96 %
Pre FEV1/FVC ratio: 44 %

## 2018-07-12 MED ORDER — TIOTROPIUM BROMIDE-OLODATEROL 2.5-2.5 MCG/ACT IN AERS: 2.0000 | INHALATION_SPRAY | Freq: Every day | RESPIRATORY_TRACT | 5 refills | Status: DC

## 2018-07-12 MED ORDER — TIOTROPIUM BROMIDE-OLODATEROL 2.5-2.5 MCG/ACT IN AERS: 2.0000 | INHALATION_SPRAY | Freq: Every day | RESPIRATORY_TRACT | 0 refills | Status: DC

## 2018-07-12 NOTE — Progress Notes (Signed)
PFT completed today.  

## 2018-07-12 NOTE — Patient Instructions (Signed)
COPD Gold Class B --REFILLED Stiolto 2 puffs daily --CONTINUE Albuterol inhaler 2 puffs every 4 hours as needed for shortness of breath or wheezing  Follow-up 6 months

## 2018-07-12 NOTE — Progress Notes (Signed)
Synopsis: Referred in 05/2018 for COPD  Subjective:   PATIENT ID: John Rollins GENDER: male DOB: 12/30/1950, MRN: [id number]   HPI  Chief Complaint  Patient presents with  . Follow-up    feeling better - more active   [name] is a 68 year old male current smoker with severe COPD who presents for follow-up of COPD.   Since our last visit on 06/08/19 when he was started on Stioloto, he reports his overall stamina and breathing has improved significantly. He previously could not walk to the mailbox located 100 yards from his house without stopping for breaks. Now he can go back and forth three times and is even challenging himself every day with more activity including walking uphill and tracking his steps with his new watch. Denies wheezing. Has minimal productive cough. Has not used his albuterol at all. Still smoking close to 1ppd but is considering quitting. Denies chest pain, palpitations or recent fevers or chills.  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  Past Medical History:  Diagnosis Date  . Cataract   . Colon polyps 11/2008   Dr. Helaine Chess adenoma.  repeat rec 3 years  . Smoker      Family History  Problem Relation Age of Onset  . Diabetes Mother   . Diabetes Father   . Cancer Neg Hx   . Heart disease Neg Hx      Social History   Occupational History  . 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 Smoking status: Current Every Day Smoker    Packs/day: 1.50    Years: 55.00    Pack years: 82.50  . Smokeless tobacco: Never Used  Substance and Sexual Activity  . Alcohol use: Yes    Comment: 1-2 beers every evening.  . Drug use: No  . Sexual activity: Yes    No Known Allergies   Outpatient Medications Prior to Visit  Medication Sig Dispense  Refill  . albuterol (PROVENTIL HFA;VENTOLIN HFA) 108 (90 Base) MCG/ACT inhaler Inhale 2 puffs into the lungs every 4 (four) hours as needed for wheezing or shortness of breath. 1 Inhaler 6  . Tiotropium Bromide-Olodaterol (STIOLTO RESPIMAT) 2.5-2.5 MCG/ACT AERS Inhale 2 puffs into the lungs daily. 1 Inhaler 3   No facility-administered medications prior to visit.     Review of Systems  Constitutional: Negative for chills, diaphoresis, fever, malaise/fatigue and weight loss.  HENT: Negative for congestion and sore throat.   Respiratory: Positive for shortness of breath. Negative for cough, hemoptysis, sputum production and wheezing.   Cardiovascular: Negative for chest pain, orthopnea, leg swelling and PND.  Gastrointestinal: Negative for abdominal pain, heartburn and nausea.  Genitourinary: Negative for frequency.  Musculoskeletal: Negative for myalgias.  Skin: Negative for rash.  Neurological: Negative for dizziness, weakness and headaches.  Endo/Heme/Allergies: Does not bruise/bleed easily.     Objective:    Vitals:   07/12/18 1328 07/12/18 1331  BP: 126/64   Pulse:  64  SpO2:  100%  Weight:  185 lb 12.8 oz (84.3 kg)  Height:  5\' 7"  (1.702 m)   SpO2: 100 % O2 Device: None (Room air)  Physical Exam: General: Well-appearing, no acute distress HENT: [place], AT, OP clear, MMM Eyes: EOMI, no scleral icterus Respiratory: Diminished breath sounds bilaterally. No crackles, wheezing or rales Cardiovascular: RRR, -M/R/G, no JVD GI: BS+, soft, nontender Extremities:-Edema,-tenderness Neuro:  AAO x4, CNII-XII grossly intact Skin: Intact, no rashes or bruising Psych: Normal mood, normal affect  Chest imaging:  CT chest lung screen 05/17/2016-emphysema, scattered pulmonary nodules bilaterally less than 6 mm  CT Chest Lung Screen 07/04/18 - no enlarging or suspicious nodules seen  PFT 05/09/2018-FEV1/FVC 46% FEV1 0.9 (30%) FVC 2.1 (48%)   PFT 07/12/18- FEV1/FVC 44% FEV1 1.5 (51%) FVC  3.21 (80%). Consistent with emphysema. Compared to last year's studies, significant improvement in FVC and FEV1 from very severe COPD to moderately COPD.  I have personally reviewed the above labs, images and tests noted above.    Assessment & Plan:   Discussion: 68 year old male with significant tobacco history use (60 pack years) who presents for management of COPD.  #Chronic obstructive pulmonary disease (COPD), uncontrolled Gold class B. No exacerbations in the last 12 months. --REFILLED Stiolto 2 puffs daily --CONTINUE Albuterol inhaler 2 puffs every 4 hours as needed for shortness of breath or wheezing  Tobacco abuse Patient is an active smoker. Last CT Lung Screen 06/2018 We discussed smoking cessation for 5 minutes. We again discussed triggers and stressors and ways to deal with them. We discussed barriers to continued smoking and benefits of smoking cessation. Provided patient with information cessation techniques and interventions including [place] quitline.   No orders of the defined types were placed in this encounter.  Meds ordered this encounter  Medications  . Tiotropium Bromide-Olodaterol (STIOLTO RESPIMAT) 2.5-2.5 MCG/ACT AERS    Sig: Inhale 2 puffs into the lungs daily.    Dispense:  1 Inhaler    Refill:  5  . Tiotropium Bromide-Olodaterol (STIOLTO RESPIMAT) 2.5-2.5 MCG/ACT AERS    Sig: Inhale 2 puffs into the lungs daily for 1 day.    Dispense:  1 Inhaler    Refill:  0    Order Specific Question:   Lot Number?    Answer:   [id number]    Order Specific Question:   Expiration Date?    Answer:   07/27/2019    Order Specific Question:   Quantity    Answer:   1    Return in about 6 months (around 01/10/2019).  Alnisa Hasley Mechele CollinJane Deontay Ladnier, MD [facility] 07/15/2018 3:20 PM  Personal pager: [phone] If unanswered, please page CCM On-call: [phone]

## 2019-01-10 ENCOUNTER — Telehealth: Payer: Self-pay | Admitting: Pulmonary Disease

## 2019-01-10 NOTE — Telephone Encounter (Signed)
There is a message in pt's appt tab in regards to recall that pt was called to get scheduled for an appt with Dr. Loanne Drilling. This is the reason for the call.    Called and spoke with pt letting him know that we were trying to see if we could get him scheduled for a follow up with Dr. Loanne Drilling and pt stated that was fine. Pt's covid screen was negative. Pt has been scheduled a follow up with Dr. Karma Greaser. 7/22 at 11:15. Nothing further needed.

## 2019-01-16 ENCOUNTER — Other Ambulatory Visit: Payer: Self-pay

## 2019-01-16 ENCOUNTER — Encounter: Payer: Self-pay | Admitting: Pulmonary Disease

## 2019-01-16 ENCOUNTER — Ambulatory Visit: Payer: Medicare Other | Admitting: Pulmonary Disease

## 2019-01-16 VITALS — BP 126/70 | HR 73 | Temp 98.1°F | Ht 69.0 in | Wt 184.2 lb

## 2019-01-16 DIAGNOSIS — J449 Chronic obstructive pulmonary disease, unspecified: Secondary | ICD-10-CM

## 2019-01-16 MED ORDER — STIOLTO RESPIMAT 2.5-2.5 MCG/ACT IN AERS: 2.0000 | INHALATION_SPRAY | Freq: Every day | RESPIRATORY_TRACT | 0 refills | Status: DC

## 2019-01-16 MED ORDER — STIOLTO RESPIMAT 2.5-2.5 MCG/ACT IN AERS: 2.0000 | INHALATION_SPRAY | Freq: Every day | RESPIRATORY_TRACT | 12 refills | Status: DC

## 2019-01-16 NOTE — Patient Instructions (Addendum)
COPD --Refilled Stioloto 2 puffs daily --CONTINUE Albuterol inhaler 2 puffs every 4 hours as needed for shortness of breath or wheezing  Follow-up in 6 months

## 2019-01-16 NOTE — Progress Notes (Signed)
Synopsis: Referred in 05/2018 for COPD  Subjective:   PATIENT ID: John Rollins GENDER: male DOB: [birth year], MRN: [id number]   HPI  Chief Complaint  Patient presents with  . Follow-up   [name] is a 68 year old male active smoker with severe COPD who presents for follow-up of COPD.  Overall he reports that his breathing is stable and has had no issues.  Since her last visit he has not had any outpatient exacerbations.  Denies ED/hospitalizations related to COPD.  Has not required prednisone.  He is compliant with his Stiolto and has not had to use his rescue inhaler.  He is able to perform activities of daily living including ambulating without the house and walking uphill.  He tries to remain active and monitors his steps with his watch.  Has occasional productive cough otherwise denies wheezing.  He has tried quitting however during the pandemic he reports continue to smoke about 1 pack/day.  Denies recent fevers, chills, chest pain.  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  Past Medical History:  Diagnosis Date  . Cataract   . Colon polyps 11/2008   Dr. Ike Bene adenoma.  repeat rec 3 years  . Smoker      Family History  Problem Relation Age of Onset  . Diabetes Mother   . Diabetes Father   . Cancer Neg Hx   . Heart disease Neg Hx      Social History   Occupational History  . 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 Smoking status: Current Every Day Smoker    Packs/day: 1.50    Years: 55.00    Pack years: 82.50  . Smokeless tobacco: Never Used  Substance and Sexual Activity  . Alcohol use: Yes    Comment: 1-2 beers every evening.  . Drug use: No  . Sexual activity: Yes    No Known Allergies   Outpatient Medications Prior to Visit   Medication Sig Dispense Refill  . albuterol (PROVENTIL HFA;VENTOLIN HFA) 108 (90 Base) MCG/ACT inhaler Inhale 2 puffs into the lungs every 4 (four) hours as needed for wheezing or shortness of breath. 1 Inhaler 6  . Tiotropium Bromide-Olodaterol (STIOLTO RESPIMAT) 2.5-2.5 MCG/ACT AERS Inhale 2 puffs into the lungs daily. 1 Inhaler 5   No facility-administered medications prior to visit.     Review of Systems  Constitutional: Negative for chills, diaphoresis, fever, malaise/fatigue and weight loss.  HENT: Negative for congestion, ear pain and sore throat.   Respiratory: Positive for cough. Negative for hemoptysis, sputum production, shortness of breath and wheezing.   Cardiovascular: Negative for chest pain, palpitations and leg swelling.  Gastrointestinal: Negative for abdominal pain, heartburn and nausea.  Genitourinary: Negative for frequency.  Musculoskeletal: Negative for joint pain and myalgias.  Skin: Negative for itching and rash.  Neurological: Negative for dizziness, weakness and headaches.  Endo/Heme/Allergies: Does not bruise/bleed easily.  Psychiatric/Behavioral: Negative for depression. The patient is not nervous/anxious.    Objective:    Vitals:   01/16/19 1136  BP: 126/70  Pulse: 73  Temp: 98.1 F (36.7 C)  TempSrc: Oral  SpO2: 94%  Weight: 184 lb 3.2 oz (83.6 kg)  Height: 5\' 9"  (1.753 m)   SpO2: 94 %   Physical Exam: General: Well-appearing, no acute distress HENT: [place], AT Eyes: EOMI, no scleral icterus Respiratory: Clear to auscultation bilaterally.  No crackles, wheezing or rales Cardiovascular: RRR, -M/R/G, no JVD GI: BS+, soft, nontender Extremities:-Edema,-tenderness Neuro: AAO x4, CNII-XII grossly intact Skin: Intact, no rashes or bruising Psych: Normal mood, normal affect  Data Reviewed:  Chest imaging CT chest lung screen 05/17/2016-emphysema, scattered pulmonary nodules bilaterally less than 6 mm CT Chest Lung Screen 07/04/18 - no enlarging  or suspicious nodules seen  PFT 05/09/2018-FEV1/FVC 46% FEV1 0.9 (30%) FVC 2.1 (48%)   07/12/18- FEV1/FVC 44% FEV1 1.5 (51%) FVC 3.21 (80%). Consistent with emphysema. Compared to last year's studies, significant improvement in FVC and FEV1 from very severe COPD to moderately COPD.  Lab CBC and CMP 03/07/19 reviewed. Hg 15.7 CO2 24  Labs, images and studies reviewed personally by me.    Assessment & Plan:   Discussion: [id number] year old male active smoker with significant tobacco use (60 pack years) presents for follow-up of COPD  COPD Gold Class B, well controlled --Refilled Stioloto 2 puffs daily --CONTINUE Albuterol inhaler 2 puffs every 4 hours as needed for shortness of breath or wheezing  Tobacco abuse Patient is an active smoker. We discussed smoking cessation for <3 minutes. We discussed triggers and stressors and ways to deal with them. We discussed barriers to continued smoking and benefits of smoking cessation.  --Due for CT lung screen in 06/2019  No orders of the defined types were placed in this encounter.  Meds ordered this encounter  Medications  . Tiotropium Bromide-Olodaterol (STIOLTO RESPIMAT) 2.5-2.5 MCG/ACT AERS    Sig: Inhale 2 puffs into the lungs daily.    Dispense:  4 g    Refill:  12  . Tiotropium Bromide-Olodaterol (STIOLTO RESPIMAT) 2.5-2.5 MCG/ACT AERS    Sig: Inhale 2 puffs into the lungs daily.    Dispense:  4 g    Refill:  0    Return in about 6 months (around 07/19/2019).  Latysha Thackston Mechele CollinJane Leupp Jon, MD [facility] 01/19/2019 1:40 PM  Personal pager: [phone] If unanswered, please page CCM On-call: [phone]

## 2019-01-19 ENCOUNTER — Encounter: Payer: Self-pay | Admitting: Pulmonary Disease

## 2019-06-18 ENCOUNTER — Other Ambulatory Visit: Payer: Self-pay

## 2019-06-18 ENCOUNTER — Encounter: Payer: Self-pay | Admitting: Family Medicine

## 2019-06-18 ENCOUNTER — Ambulatory Visit: Payer: Medicare Other | Admitting: Family Medicine

## 2019-06-18 VITALS — BP 154/82 | HR 83 | Temp 97.5°F | Ht 69.0 in | Wt 184.6 lb

## 2019-06-18 DIAGNOSIS — Z23 Encounter for immunization: Secondary | ICD-10-CM | POA: Diagnosis not present

## 2019-06-18 DIAGNOSIS — Z860100 Personal history of colon polyps, unspecified: Secondary | ICD-10-CM

## 2019-06-18 DIAGNOSIS — Z9849 Cataract extraction status, unspecified eye: Secondary | ICD-10-CM | POA: Diagnosis not present

## 2019-06-18 DIAGNOSIS — I700 Atherosclerosis of aorta: Secondary | ICD-10-CM

## 2019-06-18 DIAGNOSIS — Z1211 Encounter for screening for malignant neoplasm of colon: Secondary | ICD-10-CM

## 2019-06-18 DIAGNOSIS — R030 Elevated blood-pressure reading, without diagnosis of hypertension: Secondary | ICD-10-CM

## 2019-06-18 DIAGNOSIS — Z0000 Encounter for general adult medical examination without abnormal findings: Secondary | ICD-10-CM

## 2019-06-18 DIAGNOSIS — J439 Emphysema, unspecified: Secondary | ICD-10-CM | POA: Diagnosis not present

## 2019-06-18 DIAGNOSIS — F17200 Nicotine dependence, unspecified, uncomplicated: Secondary | ICD-10-CM

## 2019-06-18 DIAGNOSIS — H9113 Presbycusis, bilateral: Secondary | ICD-10-CM

## 2019-06-18 LAB — POCT URINALYSIS DIP (PROADVANTAGE DEVICE)
Bilirubin, UA: NEGATIVE
Blood, UA: NEGATIVE
Glucose, UA: NEGATIVE mg/dL
Ketones, POC UA: NEGATIVE mg/dL
Leukocytes, UA: NEGATIVE
Nitrite, UA: NEGATIVE
Protein Ur, POC: NEGATIVE mg/dL
Specific Gravity, Urine: 1.01
Urobilinogen, Ur: 0.2
pH, UA: 6 (ref 5.0–8.0)

## 2019-06-18 NOTE — Progress Notes (Signed)
John Rollins is a 68 y.o. male who presents for annual wellness visit and follow-up on chronic medical conditions.  He has severe COPD and continues on his inhalers.  He also continues to smoke and has no desire to quit.  He does use his rescue inhaler fairly frequently.  He does have hearing aids which he says does help.  He also has a history of colonic polyps but is not followed up appropriately.  He does have bilateral hearing aids which she says does help.  He does have x-ray evidence of atherosclerosis.  He does not complain of chest pain, PND or DOE.  Social and family history was reviewed.   Immunizations and Health Maintenance Immunization History  Administered Date(s) Administered  . Fluad Quad(high Dose 65+) 06/18/2019  . Influenza-Unspecified 03/27/2014, 04/04/2016  . Pneumococcal Conjugate-13 06/18/2019  . Tdap 06/28/2007   Health Maintenance Due  Topic Date Due  . TETANUS/TDAP  06/27/2017  . COLONOSCOPY  11/28/2018    Last colonoscopy: Over 10 years ago Last PSA: never Dentist: 15 years ago Ophtho:two years ago Exercise:walking , yard work  Other doctors caring for patient include: Dr. Everardo [facility], [facility]  Advanced Directives: not on file.  Copy asked for. Does Patient Have a Medical Advance Directive?: Yes Type of Advance Directive: Healthcare Power of Attorney, Living will Does patient want to make changes to medical advance directive?: No - Patient declined Copy of Healthcare Power of Attorney in Chart?: No - copy requested  Depression screen:  See questionnaire below.     Depression screen [facility] 2/9 06/18/2019 03/06/2018 01/18/2016 09/23/2014  Decreased Interest 0 0 0 0  Down, Depressed, Hopeless 0 0 0 0  PHQ - 2 Score 0 0 0 0    Fall Screen: See Questionaire below.   Fall Risk  06/18/2019 03/06/2018 01/18/2016 09/23/2014  Falls in the past year? 0 No No No    ADL screen:  See questionnaire below.  Functional Status Survey: Is the patient  deaf or have difficulty hearing?: No(hearing aide) Does the patient have difficulty seeing, even when wearing glasses/contacts?: No Does the patient have difficulty concentrating, remembering, or making decisions?: No Does the patient have difficulty walking or climbing stairs?: No Does the patient have difficulty dressing or bathing?: No Does the patient have difficulty doing errands alone such as visiting a doctor's office or shopping?: No   Review of Systems  Constitutional: -, -unexpected weight change, -anorexia, -fatigue Allergy: -sneezing, -itching, -congestion Dermatology: denies changing moles, rash, lumps ENT: -runny nose, -ear pain, -sore throat,  Cardiology:  -chest pain, -palpitations, -orthopnea, Respiratory: -cough, -shortness of breath, -dyspnea on exertion, -wheezing,  Gastroenterology: -abdominal pain, -nausea, -vomiting, -diarrhea, -constipation, -dysphagia Hematology: -bleeding or bruising problems Musculoskeletal: -arthralgias, -myalgias, -joint swelling, -back pain, - Ophthalmology: -vision changes,  Urology: -dysuria, -difficulty urinating,  -urinary frequency, -urgency, incontinence Neurology: -, -numbness, , -memory loss, -falls, -dizziness    PHYSICAL EXAM:  BP (!) 154/82 (BP Location: Left Arm, Patient Position: Sitting)   Pulse 83   Temp (!) 97.5 F (36.4 C)   Ht 5\' 9"  (1.753 m)   Wt 184 lb 9.6 oz (83.7 kg)   SpO2 95%   BMI 27.26 kg/m   General Appearance: Alert, cooperative, no distress, appears stated age Head: Normocephalic, without obvious abnormality, atraumatic Eyes: PERRL, conjunctiva/corneas clear, EOM's intact,  Ears: Normal TM's and external ear canals Nose: Nares normal, mucosa normal, no drainage or sinus   tenderness Throat: Lips, mucosa, and tongue normal; teeth  and gums normal Neck: Supple, no lymphadenopathy, thyroid:no enlargement/tenderness/nodules; no carotid bruit or JVD Lungs: Clear to auscultation bilaterally without  wheezes, rales or ronchi; respirations unlabored Heart: Regular rate and rhythm, S1 and S2 normal, no murmur, rub or gallop Abdomen: Soft, non-tender, nondistended, normoactive bowel sounds, no masses, no hepatosplenomegaly Skin: Skin color, texture, turgor normal, no rashes or lesions Lymph nodes: Cervical, supraclavicular, and axillary nodes normal Neurologic: CNII-XII intact, normal strength, sensation and gait; reflexes 2+ and symmetric throughout   Psych: Normal mood, affect, hygiene and grooming  ASSESSMENT/PLAN: Routine general medical examination at a health care facility - Plan: POCT Urinalysis DIP (Proadvantage Device)  Pulmonary emphysema, unspecified emphysema type ([place]) - Plan: CBC with Differential, Comprehensive metabolic panel  Smoker  History of colonic polyps - Plan: Ambulatory referral to Gastroenterology  History of cataract extraction, unspecified laterality  Elevated blood pressure reading  Atherosclerosis of aorta (HCC)  Presbycusis of both ears  Need for vaccination against Streptococcus pneumoniae - Plan: Pneumococcal conjugate vaccine 13-valent  Need for influenza vaccination - Plan: Flu Vaccine QUAD High Dose(Fluad)  Screening for colon cancer - Plan: Ambulatory referral to Gastroenterology  I have discussed smoking cessation with him with every visit and obviously he is not interested in quitting.  He will continue on his lung medications.  Do not feel the need to pursue the blood pressure at this time.  Did discuss his drinking history.   healthy diet and alcohol recommendations (less than or equal to 2 drinks/day) reviewed;Immunization recommendations discussed.  Colonoscopy recommendations reviewed.   Medicare Attestation I have personally reviewed: The patient's medical and social history Their use of alcohol, tobacco or illicit drugs Their current medications and supplements The patient's functional ability including ADLs,fall risks, home  safety risks, cognitive, and hearing and visual impairment Diet and physical activities Evidence for depression or mood disorders  The patient's weight, height, and BMI have been recorded in the chart.  I have made referrals, counseling, and provided education to the patient based on review of the above and I have provided the patient with a written personalized care plan for preventive services.     Jill Alexanders, MD   06/18/2019

## 2019-06-18 NOTE — Patient Instructions (Signed)
  Mr. Gardella , Thank you for taking time to come for your Medicare Wellness Visit. I appreciate your ongoing commitment to your health goals. Please review the following plan we discussed and let me know if I can assist you in the future.   These are the goals we discussed: Goals   None     This is a list of the screening recommended for you and due dates:  Health Maintenance  Topic Date Due  . Tetanus Vaccine  06/27/2017  . Colon Cancer Screening  11/28/2018  . Pneumonia vaccines (2 of 2 - PPSV23) 06/17/2020  . Flu Shot  Completed  .  Hepatitis C: One time screening is recommended by Center for Disease Control  (CDC) for  adults born from [birth year] through 1967.   Completed

## 2019-06-19 LAB — COMPREHENSIVE METABOLIC PANEL
ALT: 12 IU/L (ref 0–44)
AST: 20 IU/L (ref 0–40)
Albumin/Globulin Ratio: 1.4 (ref 1.2–2.2)
Albumin: 4.3 g/dL (ref 3.8–4.8)
Alkaline Phosphatase: 66 IU/L (ref 39–117)
BUN/Creatinine Ratio: 9 — ABNORMAL LOW (ref 10–24)
BUN: 7 mg/dL — ABNORMAL LOW (ref 8–27)
Bilirubin Total: 0.3 mg/dL (ref 0.0–1.2)
CO2: 23 mmol/L (ref 20–29)
Calcium: 9.4 mg/dL (ref 8.6–10.2)
Chloride: 98 mmol/L (ref 96–106)
Creatinine, Ser: 0.8 mg/dL (ref 0.76–1.27)
GFR calc Af Amer: 105 mL/min/{1.73_m2} (ref >59)
GFR calc non Af Amer: 91 mL/min/{1.73_m2} (ref >59)
Globulin, Total: 3.1 g/dL (ref 1.5–4.5)
Glucose: 97 mg/dL (ref 65–99)
Potassium: 4.8 mmol/L (ref 3.5–5.2)
Sodium: 131 mmol/L — ABNORMAL LOW (ref 134–144)
Total Protein: 7.4 g/dL (ref 6.0–8.5)

## 2019-06-19 LAB — CBC WITH DIFFERENTIAL/PLATELET
Basophils Absolute: 0.1 10*3/uL (ref 0.0–0.2)
Basos: 1 %
EOS (ABSOLUTE): 0.2 10*3/uL (ref 0.0–0.4)
Eos: 2 %
Hematocrit: 45.6 % (ref 37.5–51.0)
Hemoglobin: 15.7 g/dL (ref 13.0–17.7)
Immature Grans (Abs): 0 10*3/uL (ref 0.0–0.1)
Immature Granulocytes: 0 %
Lymphocytes Absolute: 4 10*3/uL — ABNORMAL HIGH (ref 0.7–3.1)
Lymphs: 48 %
MCH: 31.9 pg (ref 26.6–33.0)
MCHC: 34.4 g/dL (ref 31.5–35.7)
MCV: 93 fL (ref 79–97)
Monocytes Absolute: 0.7 10*3/uL (ref 0.1–0.9)
Monocytes: 8 %
Neutrophils Absolute: 3.4 10*3/uL (ref 1.4–7.0)
Neutrophils: 41 %
Platelets: 233 10*3/uL (ref 150–450)
RBC: 4.92 x10E6/uL (ref 4.14–5.80)
RDW: 13.2 % (ref 11.6–15.4)
WBC: 8.3 10*3/uL (ref 3.4–10.8)

## 2019-09-30 ENCOUNTER — Encounter: Payer: Self-pay | Admitting: Pulmonary Disease

## 2019-09-30 ENCOUNTER — Ambulatory Visit: Payer: Medicare Other | Admitting: Pulmonary Disease

## 2019-09-30 ENCOUNTER — Other Ambulatory Visit: Payer: Self-pay

## 2019-09-30 VITALS — BP 148/58 | HR 62 | Temp 97.4°F | Ht 70.0 in | Wt 183.0 lb

## 2019-09-30 DIAGNOSIS — J449 Chronic obstructive pulmonary disease, unspecified: Secondary | ICD-10-CM | POA: Diagnosis not present

## 2019-09-30 MED ORDER — STIOLTO RESPIMAT 2.5-2.5 MCG/ACT IN AERS: 2.0000 | INHALATION_SPRAY | Freq: Every day | RESPIRATORY_TRACT | 0 refills | Status: DC

## 2019-09-30 MED ORDER — ALBUTEROL SULFATE HFA 108 (90 BASE) MCG/ACT IN AERS: 2.0000 | INHALATION_SPRAY | RESPIRATORY_TRACT | 12 refills | Status: DC | PRN

## 2019-09-30 MED ORDER — STIOLTO RESPIMAT 2.5-2.5 MCG/ACT IN AERS: 2.0000 | INHALATION_SPRAY | Freq: Every day | RESPIRATORY_TRACT | 12 refills | Status: DC

## 2019-09-30 MED ORDER — ALBUTEROL SULFATE HFA 108 (90 BASE) MCG/ACT IN AERS: 2.0000 | INHALATION_SPRAY | RESPIRATORY_TRACT | 6 refills | Status: DC | PRN

## 2019-09-30 NOTE — Patient Instructions (Addendum)
69 year old male active smoker (60 pack-years) who presents for COPD follow-up. He is currently well-controlled on his current inhaler regimen. From a pulmonary standpoint, I recommend continuing bronchodilators as noted in plan below. Patient is also due for annual CT lung screening.  COPD Gold Class B, well-controlled --REFILL Stioloto 2 puffs daily --REFILL Albuterol inhaler 2 puffs every 4 hours as needed for shortness of breath or wheezing --ORDERED annual CT Lung Screen --Please contact your [facility] to discuss your medications and imaging ([phone] Dr. Dorna Mai Paruchuri)  Tobacco abuse Patient is an active smoker. Not interested in quitting at this time. We discussed smoking cessation for <3 minutes. We discussed triggers and stressors and ways to deal with them. We discussed barriers to continued smoking and benefits of smoking cessation.

## 2019-09-30 NOTE — Progress Notes (Signed)
Synopsis: Referred in 05/2018 for COPD  Subjective:   PATIENT ID: John Rollins GENDER: male DOB: 14-Feb-1951, MRN: [id number]   HPI  Chief Complaint  Patient presents with  . Follow-up   [name] is a 69 year old male active smoker with severe COPD who presents for follow-up of COPD.  Since our last visit on 01/16/19, he reports his COPD symptoms are overall stable. He is compliant with Stioloto one puff twice a day. Uses albuterol once a day with strenuous activity, usually outdoor chores that require more exertion. He will get shortness of breath but no wheezing. He has a daily cough with minimal sputum production. He is still an active smoker and does not plan to quit. Denies any ED/hospitalizations since our last visit. He plans to be more active now that the weather is improving. Denies chest pain, dizziness, hemoptysis.  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  Past Medical History:  Diagnosis Date  . Cataract   . Colon polyps 11/2008   Dr. Helaine Chess adenoma.  repeat rec 3 years  . Smoker      Outpatient Medications Prior to Visit  Medication Sig Dispense Refill  . albuterol (PROVENTIL HFA;VENTOLIN HFA) 108 (90 Base) MCG/ACT inhaler Inhale 2 puffs into the lungs every 4 (four) hours as needed for wheezing or shortness of breath. 1 Inhaler 6  . Nutritional Supplements (VITAMIN D BOOSTER PO) Take by mouth.    . Tiotropium Bromide-Olodaterol (STIOLTO RESPIMAT) 2.5-2.5 MCG/ACT AERS Inhale 2 puffs into the lungs daily. 4 g 12  . Tiotropium Bromide-Olodaterol (STIOLTO RESPIMAT) 2.5-2.5 MCG/ACT AERS Inhale 2 puffs into the lungs daily. 4 g 0   No facility-administered medications prior to visit.    Review of Systems  Constitutional: Negative for chills, diaphoresis, fever, malaise/fatigue and weight  loss.  HENT: Negative for congestion.   Respiratory: Positive for cough and sputum production. Negative for hemoptysis, shortness of breath and wheezing.   Cardiovascular: Negative for chest pain, palpitations and leg swelling.   Objective:    Vitals:   09/30/19 0924  BP: (!) 148/58  Pulse: 62  Temp: (!) 97.4 F (36.3 C)  TempSrc: Temporal  SpO2: 96%  Weight: 183 lb (83 kg)  Height: 5\' 10"  (1.778 m)      Physical Exam: General: Well-appearing, no acute distress HENT: , AT Eyes: EOMI, no scleral icterus Respiratory: Clear to auscultation bilaterally.  No crackles, wheezing or rales Cardiovascular: RRR, -M/R/G, no JVD Extremities:-Edema,-tenderness Neuro: AAO x4, CNII-XII grossly intact Skin: Intact, no rashes or bruising Psych: Normal mood, normal affect  Data Reviewed:  Chest imaging CT chest lung screen 05/17/2016-emphysema, scattered pulmonary nodules bilaterally less than 6 mm CT Chest Lung Screen 07/04/18 - no enlarging or suspicious nodules seen  PFT 05/09/2018-FEV1/FVC 46% FEV1 0.9 (30%) FVC 2.1 (48%)   07/12/18- FEV1/FVC 44% FEV1 1.5 (51%) FVC 3.21 (80%). Consistent with emphysema. Compared to last year's studies, significant improvement in FVC and FEV1 from very severe COPD to moderately COPD.    Assessment & Plan:   Discussion: 68 year old male active smoker (60 pack-years) who presents for COPD follow-up. He is currently well-controlled on his current inhaler regimen. From a pulmonary standpoint, I recommend continuing bronchodilators as noted in plan below. Patient is also due for annual CT lung screening.  COPD Gold Class B, well-controlled --REFILL Stioloto 2 puffs daily --REFILL Albuterol inhaler 2 puffs every 4 hours as needed  for shortness of breath or wheezing --ORDERED annual CT Lung Screen --[facility] contact [facility] to discuss your medications and imaging ([phone] Dr. Barbaraann Share Paruchuri)  Tobacco abuse Patient is an active smoker. Not  interested in quitting at this time. We discussed smoking cessation for <3 minutes. We discussed triggers and stressors and ways to deal with them. We discussed barriers to continued smoking and benefits of smoking cessation.   Health Maintenance Immunization History  Administered Date(s) Administered  . Fluad Quad(high Dose 65+) 06/18/2019  . Influenza-Unspecified 03/27/2014, 04/04/2016  . PFIZER SARS-COV-2 Vaccination 08/02/2019, 08/23/2019  . Pneumococcal Conjugate-13 06/18/2019  . Tdap 06/28/2007   --Due for CT lung screen. Ordered however please discuss with your [facility] provider  Orders Placed This Encounter  Procedures  . CT CHEST LUNG CA SCREEN LOW DOSE W/O CM    Next available, patient is at the [place]    Standing Status:   Future    Standing Expiration Date:   12/30/2019    Order Specific Question:   Reason for Exam (SYMPTOM  OR DIAGNOSIS REQUIRED)    Answer:   COPD lung cancer screen    Order Specific Question:   Preferred Imaging Location?    Answer:   [facility]    Order Specific Question:   Radiology Contrast Protocol - do NOT remove file path    Answer:   [email or web address]   Meds ordered this encounter  Medications  . DISCONTD: Tiotropium Bromide-Olodaterol (STIOLTO RESPIMAT) 2.5-2.5 MCG/ACT AERS    Sig: Inhale 2 puffs into the lungs daily.    Dispense:  4 g    Refill:  0    Order Specific Question:   Lot Number?    Answer:   [id number]    Order Specific Question:   Expiration Date?    Answer:   05/27/2021    Order Specific Question:   Quantity    Answer:   2  . DISCONTD: albuterol (VENTOLIN HFA) 108 (90 Base) MCG/ACT inhaler    Sig: Inhale 2 puffs into the lungs every 4 (four) hours as needed for wheezing or shortness of breath.    Dispense:  18 g    Refill:  6  . DISCONTD: Tiotropium Bromide-Olodaterol (STIOLTO RESPIMAT) 2.5-2.5 MCG/ACT AERS    Sig: Inhale 2 puffs into the lungs daily.    Dispense:  4 g    Refill:  0    Order  Specific Question:   Lot Number?    Answer:   [id number]    Order Specific Question:   Expiration Date?    Answer:   05/27/2021    Order Specific Question:   Quantity    Answer:   2  . Tiotropium Bromide-Olodaterol (STIOLTO RESPIMAT) 2.5-2.5 MCG/ACT AERS    Sig: Inhale 2 puffs into the lungs daily.    Dispense:  4 g    Refill:  12  . albuterol (VENTOLIN HFA) 108 (90 Base) MCG/ACT inhaler    Sig: Inhale 2 puffs into the lungs every 4 (four) hours as needed for wheezing or shortness of breath.    Dispense:  18 g    Refill:  12    Return in about 6 months (around 03/31/2020).  Rodman Pickle, M.D. [facility] Pulmonary/Critical Care Medicine 09/30/2019 8:49 AM

## 2019-10-04 ENCOUNTER — Ambulatory Visit (INDEPENDENT_AMBULATORY_CARE_PROVIDER_SITE_OTHER)
Admission: RE | Admit: 2019-10-04 | Discharge: 2019-10-04 | Disposition: A | Discharge status: Home / Self Care | Payer: Medicare Other | Source: Ambulatory Visit | Attending: Pulmonary Disease | Admitting: Pulmonary Disease

## 2019-10-04 ENCOUNTER — Other Ambulatory Visit: Payer: Self-pay

## 2019-10-04 DIAGNOSIS — Z87891 Personal history of nicotine dependence: Secondary | ICD-10-CM | POA: Diagnosis not present

## 2019-10-04 DIAGNOSIS — J449 Chronic obstructive pulmonary disease, unspecified: Secondary | ICD-10-CM

## 2019-10-15 ENCOUNTER — Other Ambulatory Visit: Payer: Self-pay | Admitting: *Deleted

## 2019-10-15 DIAGNOSIS — Z87891 Personal history of nicotine dependence: Secondary | ICD-10-CM

## 2019-10-15 DIAGNOSIS — F17210 Nicotine dependence, cigarettes, uncomplicated: Secondary | ICD-10-CM

## 2020-06-22 ENCOUNTER — Encounter: Payer: Self-pay | Admitting: Family Medicine

## 2020-06-22 ENCOUNTER — Other Ambulatory Visit: Payer: Self-pay

## 2020-06-22 ENCOUNTER — Ambulatory Visit: Payer: Medicare Other | Admitting: Family Medicine

## 2020-06-22 VITALS — BP 148/62 | HR 62 | Ht 70.0 in | Wt 168.8 lb

## 2020-06-22 DIAGNOSIS — Z9849 Cataract extraction status, unspecified eye: Secondary | ICD-10-CM

## 2020-06-22 DIAGNOSIS — Z23 Encounter for immunization: Secondary | ICD-10-CM

## 2020-06-22 DIAGNOSIS — E871 Hypo-osmolality and hyponatremia: Secondary | ICD-10-CM

## 2020-06-22 DIAGNOSIS — I700 Atherosclerosis of aorta: Secondary | ICD-10-CM

## 2020-06-22 DIAGNOSIS — I10 Essential (primary) hypertension: Secondary | ICD-10-CM | POA: Diagnosis not present

## 2020-06-22 DIAGNOSIS — F17200 Nicotine dependence, unspecified, uncomplicated: Secondary | ICD-10-CM | POA: Diagnosis not present

## 2020-06-22 DIAGNOSIS — J449 Chronic obstructive pulmonary disease, unspecified: Secondary | ICD-10-CM

## 2020-06-22 DIAGNOSIS — E785 Hyperlipidemia, unspecified: Secondary | ICD-10-CM | POA: Insufficient documentation

## 2020-06-22 DIAGNOSIS — Z86010 Personal history of colonic polyps: Secondary | ICD-10-CM | POA: Diagnosis not present

## 2020-06-22 DIAGNOSIS — Z0000 Encounter for general adult medical examination without abnormal findings: Secondary | ICD-10-CM

## 2020-06-22 DIAGNOSIS — H9113 Presbycusis, bilateral: Secondary | ICD-10-CM

## 2020-06-22 MED ORDER — ROSUVASTATIN CALCIUM 20 MG PO TABS: 20.0000 mg | ORAL_TABLET | Freq: Every day | ORAL | 3 refills | Status: DC

## 2020-06-22 MED ORDER — LOSARTAN POTASSIUM 50 MG PO TABS: 50.0000 mg | ORAL_TABLET | Freq: Every day | ORAL | 3 refills | Status: DC

## 2020-06-22 NOTE — Progress Notes (Signed)
John Rollins is a 69 y.o. male who presents for annual wellness visit and follow-up on chronic medical conditions.  He has no particular concerns or complaints.  He continues on his inhaler.  He continues to smoke and is not interested in quitting.  He will follow up with pulmonary within the next several months.  He has had a colonoscopy which apparently did show polyps.  I do not have the results and will therefore ask for them.  He does wear hearing aids.  Review of the record also indicates atherosclerosis.  Also his blood pressure readings over the last year have been slightly elevated.  His immunizations were reviewed.  He is retired.  His marriage and home life are going well.  Immunizations and Health Maintenance Immunization History  Administered Date(s) Administered  . Fluad Quad(high Dose 65+) 06/18/2019  . Influenza-Unspecified 03/27/2014, 04/04/2016  . PFIZER SARS-COV-2 Vaccination 08/02/2019, 08/23/2019  . Pneumococcal Conjugate-13 06/18/2019  . Tdap 06/28/2007   Health Maintenance Due  Topic Date Due  . TETANUS/TDAP  06/27/2017  . COLONOSCOPY  11/28/2018  . INFLUENZA VACCINE  01/26/2020  . COVID-19 Vaccine (3 - Booster for Pfizer series) 02/20/2020  . PNA vac Low Risk Adult (2 of 2 - PPSV23) 06/17/2020    Last colonoscopy:2021 Last PSA: Dentist:n/a Ophtho:n/a  Exercise: does not participate in regular exercise.  Other doctors caring for patient include:Schooler,Ellison  Advanced Directives:yes copy requesteed    Depression screen:  See questionnaire below.     Depression screen [facility] 2/9 06/22/2020 06/18/2019 03/06/2018 01/18/2016 09/23/2014  Decreased Interest 0 0 0 0 0  Down, Depressed, Hopeless 0 0 0 0 0  PHQ - 2 Score 0 0 0 0 0    Fall Screen: See Questionaire below.   Fall Risk  06/22/2020 06/18/2019 03/06/2018 01/18/2016 09/23/2014  Falls in the past year? 0 0 No No No  Risk for fall due to : No Fall Risks - - - -  Follow up Falls evaluation completed - - - -     ADL screen:  See questionnaire below.  Functional Status Survey: Is the patient deaf or have difficulty hearing?: Yes Does the patient have difficulty seeing, even when wearing glasses/contacts?: No Does the patient have difficulty concentrating, remembering, or making decisions?: No Does the patient have difficulty walking or climbing stairs?: Yes (sob) Does the patient have difficulty dressing or bathing?: No Does the patient have difficulty doing errands alone such as visiting a doctor's office or shopping?: No   Review of Systems  Constitutional: -, -unexpected weight change, -anorexia, -fatigue Allergy: -sneezing, -itching, -congestion Dermatology: denies changing moles, rash, lumps ENT: -runny nose, -ear pain, -sore throat,  Cardiology:  -chest pain, -palpitations, -orthopnea, Respiratory: -cough, -shortness of breath, -dyspnea on exertion, -wheezing,  Gastroenterology: -abdominal pain, -nausea, -vomiting, -diarrhea, -constipation, -dysphagia Hematology: -bleeding or bruising problems Musculoskeletal: -arthralgias, -myalgias, -joint swelling, -back pain, - Ophthalmology: -vision changes,  Urology: -dysuria, -difficulty urinating,  -urinary frequency, -urgency, incontinence Neurology: -, -numbness, , -memory loss, -falls, -dizziness    PHYSICAL EXAM:  BP (!) 148/62   Pulse 62   Ht 5\' 10"  (1.778 m)   Wt 168 lb 12.8 oz (76.6 kg)   SpO2 97%   BMI 24.22 kg/m   General Appearance: Alert, cooperative, no distress, appears stated age Head: Normocephalic, without obvious abnormality, atraumatic Eyes: PERRL, conjunctiva/corneas clear, EOM's intact,  Ears: Normal TM's and external ear canals Nose: Nares normal, mucosa normal, no drainage or sinus   tenderness Throat: Lips, mucosa,  and tongue normal; teeth and gums normal Neck: Supple, no lymphadenopathy, thyroid:no enlargement/tenderness/nodules; no carotid bruit or JVD Lungs: Clear to auscultation bilaterally without  wheezes, rales or ronchi; respirations unlabored Heart: Regular rate and rhythm, S1 and S2 normal, no murmur, rub or gallop Abdomen: Soft, non-tender, nondistended, normoactive bowel sounds, no masses, no hepatosplenomegaly Skin: Skin color, texture, turgor normal, no rashes or lesions Lymph nodes: Cervical, supraclavicular, and axillary nodes normal Neurologic: CNII-XII intact, normal strength, sensation and gait; reflexes 2+ and symmetric throughout   Psych: Normal mood, affect, hygiene and grooming  ASSESSMENT/PLAN: Routine general medical examination at a health care facility  Need for influenza vaccination - Plan: Flu vaccine HIGH DOSE PF (Fluzone High dose)  Need for COVID-19 vaccine - Plan: Pfizer SARS-COV-2 Vaccine  Smoker  History of cataract extraction, unspecified laterality  History of colonic polyps  Chronic obstructive pulmonary disease, unspecified COPD type (HCC)  Atherosclerosis of aorta (HCC) - Plan: Lipid panel, losartan (COZAAR) 50 MG tablet, rosuvastatin (CRESTOR) 20 MG tablet, DISCONTINUED: rosuvastatin (CRESTOR) 20 MG tablet  Essential hypertension - Plan: CBC with Differential/Platelet, Comprehensive metabolic panel, losartan (COZAAR) 50 MG tablet  Presbycusis of both ears  Hyperlipidemia, unspecified hyperlipidemia type - Plan: Lipid panel, rosuvastatin (CRESTOR) 20 MG tablet, DISCONTINUED: rosuvastatin (CRESTOR) 20 MG tablet Cozaar and Crestor were added to his regimen.  He is to call me in 1 month with his blood pressure readings.  He will continue on his pulmonary medications and follow-up with pulmonologist this year.  Continue to wear his hearing aids.  Recommend he go to the drugstore to get Tdap and Shingrix and then pneumonia shot there or possibly here. Immunization recommendations discussed.  Colonoscopy recommendations reviewed.   Medicare Attestation I have personally reviewed: The patient's medical and social history Their use of alcohol,  tobacco or illicit drugs Their current medications and supplements The patient's functional ability including ADLs,fall risks, home safety risks, cognitive, and hearing and visual impairment Diet and physical activities Evidence for depression or mood disorders  The patient's weight, height, and BMI have been recorded in the chart.  I have made referrals, counseling, and provided education to the patient based on review of the above and I have provided the patient with a written personalized care plan for preventive services.     Sharlot Gowda, MD   06/22/2020

## 2020-06-22 NOTE — Patient Instructions (Signed)
  [name] , Thank you for taking time to come for your Medicare Wellness Visit. I appreciate your ongoing commitment to your health goals. Please review the following plan we discussed and let me know if I can assist you in the future.   These are the goals we discussed: Send me your blood pressure readings in about a month.  Also get the Tdap, Shingrix shots at the pharmacy.  If you have any problems with the new medicines that I am giving you, give me a call. This is a list of the screening recommended for you and due dates:  Health Maintenance  Topic Date Due  . Tetanus Vaccine  06/27/2017  . Colon Cancer Screening  11/28/2018  . Flu Shot  01/26/2020  . COVID-19 Vaccine (3 - Booster for Pfizer series) 02/20/2020  . Pneumonia vaccines (2 of 2 - PPSV23) 06/17/2020  .  Hepatitis C: One time screening is recommended by Center for Disease Control  (CDC) for  adults born from [birth year] through 1967.   Completed

## 2020-06-23 LAB — CBC WITH DIFFERENTIAL/PLATELET
Basophils Absolute: 0.1 10*3/uL (ref 0.0–0.2)
Basos: 1 %
EOS (ABSOLUTE): 0.1 10*3/uL (ref 0.0–0.4)
Eos: 1 %
Hematocrit: 47 % (ref 37.5–51.0)
Hemoglobin: 15.7 g/dL (ref 13.0–17.7)
Immature Grans (Abs): 0 10*3/uL (ref 0.0–0.1)
Immature Granulocytes: 1 %
Lymphocytes Absolute: 3.1 10*3/uL (ref 0.7–3.1)
Lymphs: 40 %
MCH: 31.3 pg (ref 26.6–33.0)
MCHC: 33.4 g/dL (ref 31.5–35.7)
MCV: 94 fL (ref 79–97)
Monocytes Absolute: 0.5 10*3/uL (ref 0.1–0.9)
Monocytes: 7 %
Neutrophils Absolute: 4.1 10*3/uL (ref 1.4–7.0)
Neutrophils: 50 %
Platelets: 204 10*3/uL (ref 150–450)
RBC: 5.01 x10E6/uL (ref 4.14–5.80)
RDW: 13 % (ref 11.6–15.4)
WBC: 7.9 10*3/uL (ref 3.4–10.8)

## 2020-06-23 LAB — COMPREHENSIVE METABOLIC PANEL
ALT: 13 IU/L (ref 0–44)
AST: 24 IU/L (ref 0–40)
Albumin/Globulin Ratio: 1.4 (ref 1.2–2.2)
Albumin: 4.3 g/dL (ref 3.7–4.7)
Alkaline Phosphatase: 68 IU/L (ref 44–121)
BUN/Creatinine Ratio: 11 (ref 10–24)
BUN: 8 mg/dL (ref 8–27)
Bilirubin Total: 0.3 mg/dL (ref 0.0–1.2)
CO2: 21 mmol/L (ref 20–29)
Calcium: 9.1 mg/dL (ref 8.6–10.2)
Chloride: 93 mmol/L — ABNORMAL LOW (ref 96–106)
Creatinine, Ser: 0.73 mg/dL — ABNORMAL LOW (ref 0.76–1.27)
GFR calc Af Amer: 108 mL/min/{1.73_m2} (ref >59)
GFR calc non Af Amer: 93 mL/min/{1.73_m2} (ref >59)
Globulin, Total: 3.1 g/dL (ref 1.5–4.5)
Glucose: 96 mg/dL (ref 65–99)
Potassium: 4.3 mmol/L (ref 3.5–5.2)
Sodium: 129 mmol/L — ABNORMAL LOW (ref 134–144)
Total Protein: 7.4 g/dL (ref 6.0–8.5)

## 2020-06-23 LAB — LIPID PANEL
Chol/HDL Ratio: 2.2 ratio (ref 0.0–5.0)
Cholesterol, Total: 189 mg/dL (ref 100–199)
HDL: 85 mg/dL (ref >39)
LDL Chol Calc (NIH): 85 mg/dL (ref 0–99)
Triglycerides: 112 mg/dL (ref 0–149)
VLDL Cholesterol Cal: 19 mg/dL (ref 5–40)

## 2020-06-23 NOTE — Addendum Note (Signed)
Addended by: Ronnald Nian on: 06/23/2020 08:33 AM   Modules accepted: Orders

## 2020-07-07 ENCOUNTER — Other Ambulatory Visit: Payer: Self-pay

## 2020-07-07 ENCOUNTER — Other Ambulatory Visit: Payer: Medicare Other

## 2020-07-07 DIAGNOSIS — Z0000 Encounter for general adult medical examination without abnormal findings: Secondary | ICD-10-CM

## 2020-07-07 DIAGNOSIS — E871 Hypo-osmolality and hyponatremia: Secondary | ICD-10-CM

## 2020-07-08 LAB — COMPREHENSIVE METABOLIC PANEL
ALT: 18 IU/L (ref 0–44)
AST: 27 IU/L (ref 0–40)
Albumin/Globulin Ratio: 1.4 (ref 1.2–2.2)
Albumin: 4.2 g/dL (ref 3.7–4.7)
Alkaline Phosphatase: 69 IU/L (ref 44–121)
BUN/Creatinine Ratio: 11 (ref 10–24)
BUN: 8 mg/dL (ref 8–27)
Bilirubin Total: 0.3 mg/dL (ref 0.0–1.2)
CO2: 18 mmol/L — ABNORMAL LOW (ref 20–29)
Calcium: 9.5 mg/dL (ref 8.6–10.2)
Chloride: 95 mmol/L — ABNORMAL LOW (ref 96–106)
Creatinine, Ser: 0.73 mg/dL — ABNORMAL LOW (ref 0.76–1.27)
GFR calc Af Amer: 108 mL/min/{1.73_m2} (ref >59)
GFR calc non Af Amer: 93 mL/min/{1.73_m2} (ref >59)
Globulin, Total: 3 g/dL (ref 1.5–4.5)
Glucose: 71 mg/dL (ref 65–99)
Potassium: 5.4 mmol/L — ABNORMAL HIGH (ref 3.5–5.2)
Sodium: 131 mmol/L — ABNORMAL LOW (ref 134–144)
Total Protein: 7.2 g/dL (ref 6.0–8.5)

## 2020-09-16 ENCOUNTER — Other Ambulatory Visit: Payer: Self-pay | Admitting: Family Medicine

## 2020-09-16 DIAGNOSIS — I700 Atherosclerosis of aorta: Secondary | ICD-10-CM

## 2020-09-16 DIAGNOSIS — I10 Essential (primary) hypertension: Secondary | ICD-10-CM

## 2020-10-15 ENCOUNTER — Other Ambulatory Visit: Payer: Self-pay

## 2020-10-15 ENCOUNTER — Ambulatory Visit (INDEPENDENT_AMBULATORY_CARE_PROVIDER_SITE_OTHER)
Admission: RE | Admit: 2020-10-15 | Discharge: 2020-10-15 | Disposition: A | Discharge status: Home / Self Care | Payer: Medicare Other | Source: Ambulatory Visit | Attending: Acute Care | Admitting: Acute Care

## 2020-10-15 DIAGNOSIS — F17210 Nicotine dependence, cigarettes, uncomplicated: Secondary | ICD-10-CM | POA: Diagnosis not present

## 2020-10-15 DIAGNOSIS — Z87891 Personal history of nicotine dependence: Secondary | ICD-10-CM

## 2020-10-16 ENCOUNTER — Telehealth: Payer: Self-pay | Admitting: Acute Care

## 2020-10-16 NOTE — Telephone Encounter (Signed)
Call report received for this patient  IMPRESSION: 1. Lung-RADS 4A, suspicious. Follow up low-dose chest CT without contrast in 3 months (please use the following order, "CT CHEST LCS NODULE FOLLOW-UP W/O CM") is recommended. Alternatively, PET may be considered when there is a solid component 58mm or larger. 2. Aortic Atherosclerosis (ICD10-I70.0) and Emphysema (ICD10-J43.9).  These results will be called to the ordering clinician or representative by the Radiologist Assistant, and communication documented in the PACS or [employer].   Electronically Signed   By: Kennith Center M.D.   On: 10/16/2020 08:12

## 2020-10-29 ENCOUNTER — Telehealth: Payer: Self-pay | Admitting: Acute Care

## 2020-10-29 DIAGNOSIS — F17210 Nicotine dependence, cigarettes, uncomplicated: Secondary | ICD-10-CM

## 2020-10-29 DIAGNOSIS — Z87891 Personal history of nicotine dependence: Secondary | ICD-10-CM

## 2020-10-29 NOTE — Progress Notes (Signed)
I have called the patient to review his lung cancer screening scan. There was no answer. I have left a HIPPA compliant message requesting the patient call the office. I provided the call back number. We will await the return call.   Angelique Blonder, If he calls back, please let him know he does have a nodule that is abnormal, but that it looks like it could be infectious or inflammatory. If he has been sick, set him up for a televisit with me next Wednesday. If he has not been sick, we will just schedule as a 3 month follow up.  Thanks so much

## 2020-10-29 NOTE — Telephone Encounter (Signed)
Order placed for 3 mth f/u Low dose nodule f/u CT. Copy Faxed to PCP.

## 2020-10-29 NOTE — Telephone Encounter (Signed)
I have called and spoken with the patient about his results.I explained that his scan was read as a Lung RADS 4 A : suspicious findings, either short term follow up in 3 months or alternatively  PET Scan evaluation may be considered when there is a solid component of  8 mm or larger.I explained that radiology felt this may be an inflammatory/ infectious change. Area is < 8 mm so too small for PET imaging.  Patient states he has not been sick. He is in agreement with a 3 month follow up Low Dose CT.  Angelique Blonder, please place order for 3 month follow up and fax results to PCP. Let them know we will re-image in 3 months for stability. Thanks so much

## 2020-11-18 ENCOUNTER — Telehealth: Payer: Self-pay | Admitting: Family Medicine

## 2020-11-18 NOTE — Telephone Encounter (Signed)
Left message regarding placard. Told pt to call back, placard in is folder, per dr Susann Givens if this is a breathing issues this needs to go to his pulmonary dr

## 2020-11-18 NOTE — Telephone Encounter (Signed)
Pt's wife came in and dropped off a disability parking placard. Please call wife at [phone] when completed.

## 2020-11-25 ENCOUNTER — Ambulatory Visit: Payer: Medicare Other | Admitting: Pulmonary Disease

## 2020-11-25 ENCOUNTER — Other Ambulatory Visit: Payer: Self-pay

## 2020-11-25 ENCOUNTER — Encounter: Payer: Self-pay | Admitting: Pulmonary Disease

## 2020-11-25 VITALS — BP 152/64 | HR 70 | Temp 98.6°F | Ht 70.0 in | Wt 158.4 lb

## 2020-11-25 DIAGNOSIS — F17200 Nicotine dependence, unspecified, uncomplicated: Secondary | ICD-10-CM

## 2020-11-25 DIAGNOSIS — R911 Solitary pulmonary nodule: Secondary | ICD-10-CM

## 2020-11-25 DIAGNOSIS — J449 Chronic obstructive pulmonary disease, unspecified: Secondary | ICD-10-CM

## 2020-11-25 MED ORDER — ALBUTEROL SULFATE HFA 108 (90 BASE) MCG/ACT IN AERS: 2.0000 | INHALATION_SPRAY | RESPIRATORY_TRACT | 12 refills | Status: AC | PRN

## 2020-11-25 MED ORDER — STIOLTO RESPIMAT 2.5-2.5 MCG/ACT IN AERS: 2.0000 | INHALATION_SPRAY | Freq: Every day | RESPIRATORY_TRACT | 12 refills | Status: DC

## 2020-11-25 NOTE — Progress Notes (Signed)
Synopsis: Referred in 05/2018 for COPD  Subjective:   PATIENT ID: John Rollins GENDER: male DOB: [birth year], MRN: [id number]   HPI  Chief Complaint  Patient presents with   Follow-up    No complaints currently   [name] is a 70 year-old male active smoker with severe COPD who presents for follow-up of COPD.  Since our last visit on 09/30/19, he reports his COPD symptoms are stable. He uses Stiolto daily and albuterol twice a day with activity. He has chronic cough and shortness of breath with exertion. Denies wheezing. He continues smoke. He has to stop when walking but rests improving. 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   Past Medical History:  Diagnosis Date   Cataract    Colon polyps 11/2008   Dr. Helaine Chess adenoma.  repeat rec 3 years   Smoker      Outpatient Medications Prior to Visit  Medication Sig Dispense Refill   albuterol (VENTOLIN HFA) 108 (90 Base) MCG/ACT inhaler Inhale 2 puffs into the lungs every 4 (four) hours as needed for wheezing or shortness of breath. 18 g 12   losartan (COZAAR) 50 MG tablet TAKE 1 TABLET BY MOUTH EVERY DAY 90 tablet 0   Nutritional Supplements (VITAMIN D BOOSTER PO) Take by mouth.     rosuvastatin (CRESTOR) 20 MG tablet Take 1 tablet (20 mg total) by mouth daily. 90 tablet 3   Tiotropium Bromide-Olodaterol (STIOLTO RESPIMAT) 2.5-2.5 MCG/ACT AERS Inhale 2 puffs into the lungs daily. 4 g 12   No facility-administered medications prior to visit.    Review of Systems  Constitutional:  Negative for chills, diaphoresis, fever, malaise/fatigue and weight loss.  HENT:  Negative for congestion.   Respiratory:  Positive for cough and sputum production. Negative for hemoptysis, shortness of breath and  wheezing.   Cardiovascular:  Negative for chest pain, palpitations and leg swelling.  Objective:    Vitals:   11/25/20 1402  BP: (!) 152/64  Pulse: 70  Temp: 98.6 F (37 C)  TempSrc: Temporal  SpO2: 91%  Weight: 158 lb 6.4 oz (71.8 kg)  Height: 5\' 10"  (1.778 m)   SpO2: 91 % (Room air) O2 Device: None (Room air)  Physical Exam: General: Well-appearing, no acute distress HENT: [place], AT, OP clear, MMM Eyes: EOMI, no scleral icterus Respiratory: Clear to auscultation bilaterally.  No crackles, wheezing or rales Cardiovascular: RRR, -M/R/G, no JVD Extremities:-Edema,-tenderness Neuro: AAO x4, CNII-XII grossly intact Psych: Normal mood, normal affect  Data Reviewed:  Chest imaging CT chest lung screen 05/17/2016-emphysema, scattered pulmonary nodules bilaterally less than 6 mm CT Chest Lung Screen 07/04/18 - no enlarging or suspicious nodules seen CT Chest Lung Screen 10/03/20 - centrilobular and paraseptal emphysema. New RLL nodule measuring 7.7 mm  PFT 05/09/2018-FEV1/FVC 46% FEV1 0.9 (30%) FVC 2.1 (48%)   07/12/18- FEV1/FVC 44% FEV1 1.5 (51%) FVC 3.21 (80%). Consistent with emphysema. Compared to last year's studies, significant improvement in FVC and FEV1 from very severe COPD to moderately COPD.  Imaging, labs and test noted above have been reviewed independently by me.    Assessment & Plan:   Discussion: 70 year old male active smoker (>80 pack years) who presents for COPD follow-up. Stable symptoms however remains symptomatic. Filled out handicap form today. Reviewed CT Chest for lung nodule and recommended 3 month follow-up.  COPD Gold  Class B, symptomatic however mMRC <2. No exacerbations. --REFILLStioloto 2 puffs daily --REFILL Albuterol inhaler 2 puffs every 4 hours as needed for shortness of breath or wheezing  RLL lung nodule measuring ~7.7 mm --CT Lung scheduled for July 2022  Tobacco abuse Patient is an active smoker. We discussed smoking cessation for 5  minutes. We discussed triggers and stressors and ways to deal with them. We discussed barriers to continued smoking and benefits of smoking cessation. Provided patient with information cessation techniques and interventions including [place] quitline.   Health Maintenance Immunization History  Administered Date(s) Administered   Fluad Quad(high Dose 65+) 06/18/2019, 06/22/2020   Influenza-Unspecified 03/27/2014, 04/04/2016, 06/28/2019, 05/27/2020   PFIZER(Purple Top)SARS-COV-2 Vaccination 08/02/2019, 08/23/2019, 06/22/2020   Pneumococcal Conjugate-13 06/18/2019   Tdap 06/28/2007    No orders of the defined types were placed in this encounter.  Meds ordered this encounter  Medications   Tiotropium Bromide-Olodaterol (STIOLTO RESPIMAT) 2.5-2.5 MCG/ACT AERS    Sig: Inhale 2 puffs into the lungs daily.    Dispense:  4 g    Refill:  12   albuterol (VENTOLIN HFA) 108 (90 Base) MCG/ACT inhaler    Sig: Inhale 2 puffs into the lungs every 4 (four) hours as needed for wheezing or shortness of breath.    Dispense:  18 g    Refill:  12    Return in about 3 months (around 02/25/2021).  I have spent a total time of 35-minutes on the day of the appointment reviewing prior documentation, coordinating care and discussing medical diagnosis and plan with the patient/family. Imaging, labs and tests included in this note have been reviewed and interpreted independently by me.   Mechele Collin, M.D. [facility] Pulmonary/Critical Care Medicine 11/25/2020 2:28 PM

## 2020-11-25 NOTE — Patient Instructions (Addendum)
COPD Gold Class B, well-controlled --REFILLStioloto 2 puffs daily --REFILL Albuterol inhaler 2 puffs every 4 hours as needed for shortness of breath or wheezing  RLL lung nodule measuring ~7.7 mm --CT Lung scheduled for July 2022  Tobacco abuse Patient is an active smoker. We discussed smoking cessation for 5 minutes. We discussed triggers and stressors and ways to deal with them. We discussed barriers to continued smoking and benefits of smoking cessation. Provided patient with information cessation techniques and interventions including [place] quitline.   Follow-up with 6 months    Tobacco Use Disorder Tobacco use disorder (TUD) occurs when a person craves, seeks, and uses tobacco, regardless of the consequences. This disorder can cause problems with mental and physical health. It can affect your ability to have healthy relationships, and it can keep you from meeting your responsibilities at work, home, or school. Tobacco may be:  Smoked as a cigarette or cigar.  Inhaled using e-cigarettes.  Smoked in a pipe or hookah.  Chewed as smokeless tobacco.  Inhaled into the nostrils as snuff. Tobacco products contain a dangerous chemical called nicotine, which is very addictive. Nicotine triggers hormones that make the body feel stimulated and works on areas of the brain that make you feel good. These effects can make it hard for people to quit nicotine. Tobacco contains many other unsafe chemicals that can damage almost every organ in the body. Smoking tobacco also puts others in danger due to fire risk and possible health problems caused by breathing in secondhand smoke. What are the signs or symptoms? Symptoms of TUD may include:  Being unable to slow down or stop your tobacco use.  Spending an abnormal amount of time getting or using tobacco.  Craving tobacco. Cravings may last for up to 6 months after quitting.  Tobacco use that: ? Interferes with your work, school, or home  life. ? Interferes with your personal and social relationships. ? Makes you give up activities that you once enjoyed or found important.  Using tobacco even though you know that it is: ? Dangerous or bad for your health or someone else's health. ? Causing problems in your life.  Needing more and more of the substance to get the same effect (developing tolerance).  Experiencing unpleasant symptoms if you do not use the substance (withdrawal). Withdrawal symptoms may include: ? Depressed, anxious, or irritable mood. ? Difficulty concentrating. ? Increased appetite. ? Restlessness or trouble sleeping.  Using the substance to avoid withdrawal. How is this diagnosed? This condition may be diagnosed based on:  Your current and past tobacco use. Your health care provider may ask questions about how your tobacco use affects your life.  A physical exam. You may be diagnosed with TUD if you have at least two symptoms within a 61-month period. How is this treated? This condition is treated by stopping tobacco use. Many people are unable to quit on their own and need help. Treatment may include:  Nicotine replacement therapy (NRT). NRT provides nicotine without the other harmful chemicals in tobacco. NRT gradually lowers the dosage of nicotine in the body and reduces withdrawal symptoms. NRT is available as: ? Over-the-counter gums, lozenges, and skin patches. ? Prescription mouth inhalers and nasal sprays.  Medicine that acts on the brain to reduce cravings and withdrawal symptoms.  A type of talk therapy that examines your triggers for tobacco use, how to avoid them, and how to cope with cravings (behavioral therapy).  Hypnosis. This may help with withdrawal symptoms.  Joining  a support group for others coping with TUD. The best treatment for TUD is usually a combination of medicine, talk therapy, and support groups. Recovery can be a long process. Many people start using tobacco again  after stopping (relapse). If you relapse, it does not mean that treatment will not work. Follow these instructions at home: Lifestyle  Do not use any products that contain nicotine or tobacco, such as cigarettes and e-cigarettes.  Avoid things that trigger tobacco use as much as you can. Triggers include people and situations that usually cause you to use tobacco.  Avoid drinks that contain caffeine, including coffee. These may worsen some withdrawal symptoms.  Find ways to manage stress. Wanting to smoke may cause stress, and stress can make you want to smoke. Relaxation techniques such as deep breathing, meditation, and yoga may help.  Attend support groups as needed. These groups are an important part of long-term recovery for many people. General instructions  Take over-the-counter and prescription medicines only as told by your health care provider.  Check with your health care provider before taking any new prescription or over-the-counter medicines.  Decide on a friend, family member, or smoking quit-line (such as 1-800-QUIT-NOW in the [place] that you can call or text when you feel the urge to smoke or when you need help coping with cravings.  Keep all follow-up visits as told by your health care provider and therapist. This is important.   Contact a health care provider if:  You are not able to take your medicines as prescribed.  Your symptoms get worse, even with treatment. Summary  Tobacco use disorder (TUD) occurs when a person craves, seeks, and uses tobacco regardless of the consequences.  This condition may be diagnosed based on your current and past tobacco use and a physical exam.  Many people are unable to quit on their own and need help. Recovery can be a long process.  The most effective treatment for TUD is usually a combination of medicine, talk therapy, and support groups. This information is not intended to replace advice given to you by your health care  provider. Make sure you discuss any questions you have with your health care provider. Document Revised: 05/31/2017 Document Reviewed: 05/31/2017 Elsevier Patient Education  2021 [employer].

## 2020-12-16 ENCOUNTER — Other Ambulatory Visit: Payer: Self-pay | Admitting: Family Medicine

## 2020-12-16 DIAGNOSIS — I700 Atherosclerosis of aorta: Secondary | ICD-10-CM

## 2020-12-16 DIAGNOSIS — I10 Essential (primary) hypertension: Secondary | ICD-10-CM

## 2021-01-25 ENCOUNTER — Ambulatory Visit (INDEPENDENT_AMBULATORY_CARE_PROVIDER_SITE_OTHER)
Admission: RE | Admit: 2021-01-25 | Discharge: 2021-01-25 | Disposition: A | Discharge status: Home / Self Care | Payer: Medicare Other | Source: Ambulatory Visit | Attending: Acute Care | Admitting: Acute Care

## 2021-01-25 ENCOUNTER — Other Ambulatory Visit: Payer: Self-pay

## 2021-01-25 DIAGNOSIS — F17210 Nicotine dependence, cigarettes, uncomplicated: Secondary | ICD-10-CM | POA: Diagnosis not present

## 2021-01-25 DIAGNOSIS — Z87891 Personal history of nicotine dependence: Secondary | ICD-10-CM | POA: Diagnosis not present

## 2021-02-05 NOTE — Progress Notes (Signed)
Please call patient and let them  know their  low dose Ct was read as a Lung RADS 2: nodules that are benign in appearance and behavior with a very low likelihood of becoming a clinically active cancer due to size or lack of growth. Recommendation per radiology is for a repeat LDCT in 12 months. Please let them  know we will order and schedule their  annual screening scan for 01/2022. Please let them  know there was notation of CAD on their  scan.  Please remind the patient  that this is a non-gated exam therefore degree or severity of disease  cannot be determined. Please have them  follow up with their PCP regarding potential risk factor modification, dietary therapy or pharmacologic therapy if clinically indicated. Pt.  is  currently on statin therapy. Please place order for annual  screening scan for  01/2022 and fax results to PCP. Thanks so much. Pt has 3 vessel CAD. Please have them follow up with PCP.

## 2021-02-09 ENCOUNTER — Other Ambulatory Visit: Payer: Self-pay | Admitting: *Deleted

## 2021-02-09 DIAGNOSIS — F17210 Nicotine dependence, cigarettes, uncomplicated: Secondary | ICD-10-CM

## 2021-02-09 DIAGNOSIS — Z87891 Personal history of nicotine dependence: Secondary | ICD-10-CM

## 2021-03-18 ENCOUNTER — Other Ambulatory Visit: Payer: Self-pay | Admitting: Family Medicine

## 2021-03-18 DIAGNOSIS — I700 Atherosclerosis of aorta: Secondary | ICD-10-CM

## 2021-03-18 DIAGNOSIS — I10 Essential (primary) hypertension: Secondary | ICD-10-CM

## 2021-05-26 ENCOUNTER — Ambulatory Visit: Payer: Medicare Other | Admitting: Pulmonary Disease

## 2021-05-27 ENCOUNTER — Other Ambulatory Visit: Payer: Self-pay

## 2021-05-27 ENCOUNTER — Encounter: Payer: Self-pay | Admitting: Pulmonary Disease

## 2021-05-27 ENCOUNTER — Ambulatory Visit: Payer: Medicare Other | Admitting: Pulmonary Disease

## 2021-05-27 VITALS — BP 130/70 | HR 77 | Temp 98.3°F | Ht 70.0 in | Wt 159.2 lb

## 2021-05-27 DIAGNOSIS — F17210 Nicotine dependence, cigarettes, uncomplicated: Secondary | ICD-10-CM | POA: Diagnosis not present

## 2021-05-27 DIAGNOSIS — J449 Chronic obstructive pulmonary disease, unspecified: Secondary | ICD-10-CM | POA: Diagnosis not present

## 2021-05-27 MED ORDER — TRELEGY ELLIPTA 200-62.5-25 MCG/ACT IN AEPB: 1.0000 | INHALATION_SPRAY | Freq: Every day | RESPIRATORY_TRACT | 1 refills | Status: DC

## 2021-05-27 NOTE — Patient Instructions (Signed)
COPD Gold Class B, symptomatic however mMRC >2. No exacerbations. --START Trelegy 200-62.5-25 ONE puff ONCE a day --CONTINUE Albuterol inhaler 2 puffs every 4 hours as needed for shortness of breath or wheezing --ARRANGE for pulmonary function tests --Cut down on smoking!  Follow-up with me in 2 months with PFTs prior to visit

## 2021-05-27 NOTE — Progress Notes (Signed)
Synopsis: Referred in 05/2018 for COPD  Subjective:   PATIENT ID: John Rollins GENDER: male DOB: 1951/01/02, MRN: [id number]   HPI  Chief Complaint  Patient presents with   Follow-up    copd   [name] is a 70 year-old male active smoker with severe COPD who presents for follow-up.  Synopsis: 2019 - Established care with [facility] for COPD. Started on Stiolto with improvement in symptoms 2020 - No exacerbations. Uses stiolto and albuterol once daily 2022 - No exacerbations. However stepped up to Trelegy for increased symptoms  05/27/21 Reports his symptoms are stable. Compliant with his Stiolto. Will use albuterol 2-3 times a day with activity. Shortness of breath and chronic cough at baseline. Denies wheezing. Denies COPD exacerbations since last visit. 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 Helaine Chess adenoma.  repeat rec 3 years   Smoker      Outpatient Medications Prior to Visit  Medication Sig Dispense Refill   albuterol (VENTOLIN HFA) 108 (90 Base) MCG/ACT inhaler Inhale 2 puffs into the lungs every 4 (four) hours as needed for wheezing or shortness of breath. 18 g 12   Nutritional Supplements (VITAMIN D BOOSTER PO) Take by mouth.     rosuvastatin (CRESTOR) 20 MG tablet Take 1 tablet (20 mg total) by mouth daily. 90 tablet 3   Tiotropium Bromide-Olodaterol (STIOLTO RESPIMAT) 2.5-2.5 MCG/ACT AERS Inhale 2 puffs into the lungs daily. 4 g 12   losartan (COZAAR) 50 MG tablet TAKE 1 TABLET BY MOUTH EVERY DAY 90 tablet 0   No facility-administered medications prior to visit.    Review of Systems  Constitutional:  Negative for chills, diaphoresis, fever, malaise/fatigue and weight loss.  HENT:  Negative for congestion.   Respiratory:   Positive for cough and shortness of breath. Negative for hemoptysis, sputum production and wheezing.   Cardiovascular:  Negative for chest pain, palpitations and leg swelling.  Objective:    Vitals:   05/27/21 0955  BP: 130/70  Pulse: 77  Temp: 98.3 F (36.8 C)  TempSrc: Oral  SpO2: 93%  Weight: 159 lb 3.2 oz (72.2 kg)  Height: 5\' 10"  (1.778 m)   SpO2: 93 % O2 Device: None (Room air)  Physical Exam: General: Well-appearing, no acute distress HENT: [place], AT Eyes: EOMI, no scleral icterus Respiratory: Clear to auscultation bilaterally.  No crackles, wheezing or rales Cardiovascular: RRR, -M/R/G, no JVD Extremities:-Edema,-tenderness Neuro: AAO x4, CNII-XII grossly intact Psych: Normal mood, normal affect  Data Reviewed:  Chest imaging CT chest lung screen 05/17/2016-emphysema, scattered pulmonary nodules bilaterally less than 6 mm CT Chest Lung Screen 07/04/18 - no enlarging or suspicious nodules seen CT Chest Lung Screen 10/15/20 - centrilobular and paraseptal emphysema. New RLL nodule measuring 7.7 mm CT Chest Lung Screen 01/25/21 - centrilobular and paraseptal emphysema. Resolving RLL nodule now 3.8 mm  PFT 05/09/2018-FEV1/FVC 46% FEV1 0.9 (30%) FVC 2.1 (48%)   07/12/18- FEV1/FVC 44% FEV1 1.5 (51%) FVC 3.21 (80%). Consistent with emphysema. Compared to last year's studies, significant improvement in FVC and FEV1 from very severe COPD to moderately COPD.  CBC    Component Value Date/Time   WBC 7.9 06/22/2020 0905   WBC 10.0 09/23/2014 0001   RBC 5.01 06/22/2020 0905   RBC 4.68 09/23/2014 0001   HGB 15.7  06/22/2020 0905   HCT 47.0 06/22/2020 0905   PLT 204 06/22/2020 0905   MCV 94 06/22/2020 0905   MCH 31.3 06/22/2020 0905   MCH 31.6 09/23/2014 0001   MCHC 33.4 06/22/2020 0905   MCHC 33.7 09/23/2014 0001   RDW 13.0 06/22/2020 0905   LYMPHSABS 3.1 06/22/2020 0905   MONOABS 0.9 09/23/2014 0001   EOSABS 0.1 06/22/2020 0905   BASOSABS 0.1 06/22/2020 0905   Absolute  eos 06/22/20 - 100    Assessment & Plan:   Discussion: 70 year old male active smoker (>80 pack-years) who presents for COPD follow-up. Remains symptomatic with mMRC >2. Filled out handicap form today.  COPD Gold Class B, symptomatic however mMRC >2. No exacerbations. --START Trelegy 200-62.5-25 ONE puff ONCE a day --CONTINUE Albuterol inhaler 2 puffs every 4 hours as needed for shortness of breath or wheezing --ARRANGE for pulmonary function tests --Cut down on smoking!  RLL lung nodule measuring ~7.7 mm now resolving to 3.8 mm --Return to annual follow-up  Tobacco abuse Patient is an active smoker. Down from 1.5 ppd to 1ppd. We discussed smoking cessation for 3 minutes. We discussed triggers and stressors and ways to deal with them. We discussed barriers to continued smoking and benefits of smoking cessation. Provided patient with information cessation techniques and interventions including [place] quitline.  Health Maintenance Immunization History  Administered Date(s) Administered   Fluad Quad(high Dose 65+) 06/18/2019, 06/22/2020   Influenza-Unspecified 03/27/2014, 04/04/2016, 06/28/2019, 05/27/2020   PFIZER(Purple Top)SARS-COV-2 Vaccination 08/02/2019, 08/23/2019, 06/22/2020   Pneumococcal Conjugate-13 06/18/2019   Tdap 06/28/2007    No orders of the defined types were placed in this encounter.  Meds ordered this encounter  Medications   Fluticasone-Umeclidin-Vilant (TRELEGY ELLIPTA) 200-62.5-25 MCG/ACT AEPB    Sig: Inhale 1 puff into the lungs daily.    Dispense:  60 each    Refill:  1    Return in about 2 months (around 07/28/2021).  I have spent a total time of 32-minutes on the day of the appointment reviewing prior documentation, coordinating care and discussing medical diagnosis and plan with the patient/family. Past medical history, allergies, medications were reviewed. Pertinent imaging, labs and tests included in this note have been reviewed and interpreted  independently by me.  Mechele Collin, M.D. [facility] Medicine 05/27/2021 10:18 AM

## 2021-05-30 ENCOUNTER — Encounter: Payer: Self-pay | Admitting: Pulmonary Disease

## 2021-06-02 ENCOUNTER — Telehealth: Payer: Self-pay | Admitting: Pulmonary Disease

## 2021-06-02 MED ORDER — TRELEGY ELLIPTA 200-62.5-25 MCG/ACT IN AEPB: 1.0000 | INHALATION_SPRAY | Freq: Every day | RESPIRATORY_TRACT | 1 refills | Status: DC

## 2021-06-02 NOTE — Telephone Encounter (Signed)
I called the patient and he reports he had called [facility] and the [facility] [place] and neither one had a prescription. I let him know I had sent in a new prescription. Nothing further needed.

## 2021-06-03 ENCOUNTER — Other Ambulatory Visit: Payer: Self-pay | Admitting: Family Medicine

## 2021-06-03 DIAGNOSIS — E785 Hyperlipidemia, unspecified: Secondary | ICD-10-CM

## 2021-06-03 DIAGNOSIS — I700 Atherosclerosis of aorta: Secondary | ICD-10-CM

## 2021-06-08 ENCOUNTER — Telehealth: Payer: Self-pay | Admitting: Pulmonary Disease

## 2021-06-08 MED ORDER — TRELEGY ELLIPTA 200-62.5-25 MCG/ACT IN AEPB: 1.0000 | INHALATION_SPRAY | Freq: Every day | RESPIRATORY_TRACT | 5 refills | Status: DC

## 2021-06-08 NOTE — Telephone Encounter (Signed)
Called and spoke with pt who stated he would like to have the Trelegy to be sent to [facility] instead of the [facility] pharmacy. Rx has been sent to pharmacy for pt. Nothing further needed.

## 2021-06-14 ENCOUNTER — Other Ambulatory Visit: Payer: Self-pay | Admitting: Family Medicine

## 2021-06-14 DIAGNOSIS — I700 Atherosclerosis of aorta: Secondary | ICD-10-CM

## 2021-06-14 DIAGNOSIS — I10 Essential (primary) hypertension: Secondary | ICD-10-CM

## 2021-06-25 ENCOUNTER — Other Ambulatory Visit: Payer: Self-pay

## 2021-06-25 ENCOUNTER — Ambulatory Visit: Payer: Medicare Other | Admitting: Family Medicine

## 2021-06-25 ENCOUNTER — Encounter: Payer: Self-pay | Admitting: Family Medicine

## 2021-06-25 VITALS — BP 140/80 | HR 82 | Ht 70.0 in | Wt 162.8 lb

## 2021-06-25 DIAGNOSIS — H9113 Presbycusis, bilateral: Secondary | ICD-10-CM

## 2021-06-25 DIAGNOSIS — I700 Atherosclerosis of aorta: Secondary | ICD-10-CM

## 2021-06-25 DIAGNOSIS — Z86010 Personal history of colonic polyps: Secondary | ICD-10-CM

## 2021-06-25 DIAGNOSIS — I10 Essential (primary) hypertension: Secondary | ICD-10-CM | POA: Diagnosis not present

## 2021-06-25 DIAGNOSIS — F17200 Nicotine dependence, unspecified, uncomplicated: Secondary | ICD-10-CM

## 2021-06-25 DIAGNOSIS — E785 Hyperlipidemia, unspecified: Secondary | ICD-10-CM

## 2021-06-25 DIAGNOSIS — Z23 Encounter for immunization: Secondary | ICD-10-CM

## 2021-06-25 DIAGNOSIS — Z0000 Encounter for general adult medical examination without abnormal findings: Secondary | ICD-10-CM | POA: Diagnosis not present

## 2021-06-25 DIAGNOSIS — J449 Chronic obstructive pulmonary disease, unspecified: Secondary | ICD-10-CM | POA: Diagnosis not present

## 2021-06-25 MED ORDER — ROSUVASTATIN CALCIUM 20 MG PO TABS: 20.0000 mg | ORAL_TABLET | Freq: Every day | ORAL | 3 refills | Status: AC

## 2021-06-25 MED ORDER — LOSARTAN POTASSIUM 100 MG PO TABS: 100.0000 mg | ORAL_TABLET | Freq: Every day | ORAL | 3 refills | Status: DC

## 2021-06-25 NOTE — Progress Notes (Signed)
John Rollins is a 70 y.o. male who presents for annual wellness visit and follow-up on chronic medical conditions.  He has no particular concerns or complaints.  He does have presbycusis history of aortic atherosclerosis.  And did lose a hearing aid.  He plans to go back to the audiologist to get 1.  He continues to smoke and has no desire to quit smoking.  He is now seeing pulmonary and is on 2 inhalers.  He is using the albuterol inhaler several times per day and is scheduled to see pulmonary in February.  Does have a history of colonic polyps.  He did receive a letter in 2021 concerning their inability to schedule him and was supposed to call them back.  We will reinforce that.  He continues on losartan and is having no difficulty with that.  He takes Crestor for his hyperlipidemia and also aortic atherosclerosis.  He continues on losartan and have no difficulty with that.   Immunizations and Health Maintenance Immunization History  Administered Date(s) Administered   Fluad Quad(high Dose 65+) 06/18/2019, 06/22/2020   Influenza-Unspecified 03/27/2014, 04/04/2016, 06/28/2019, 05/27/2020   PFIZER(Purple Top)SARS-COV-2 Vaccination 08/02/2019, 08/23/2019, 06/22/2020   Pneumococcal Conjugate-13 06/18/2019   Tdap 06/28/2007   Health Maintenance Due  Topic Date Due   Zoster Vaccines- Shingrix (1 of 2) Never done   TETANUS/TDAP  06/27/2017   COLONOSCOPY (Pts 45-73yrs Insurance coverage will need to be confirmed)  11/28/2018   Pneumonia Vaccine 29+ Years old (2 - PPSV23 if available, else PCV20) 06/17/2020   COVID-19 Vaccine (4 - Booster for Pfizer series) 08/17/2020    Last colonoscopy: not sure last but within the last 10 years Last PSA: not sure Dentist: has dentures  Ophtho: none Exercise: some  Other doctors caring for patient include[facility] Ever[facility]   Advanced Directives: Copy asked for    Depression screen:  See questionnaire below.     Depression screen [facility] 2/9  06/25/2021 06/22/2020 06/18/2019 03/06/2018 01/18/2016  Decreased Interest 0 0 0 0 0  Down, Depressed, Hopeless 0 0 0 0 0  PHQ - 2 Score 0 0 0 0 0    Fall Screen: See Questionaire below.   Fall Risk  06/25/2021 06/22/2020 06/18/2019 03/06/2018 01/18/2016  Falls in the past year? 0 0 0 No No  Number falls in past yr: 0 - - - -  Injury with Fall? 0 - - - -  Risk for fall due to : No Fall Risks No Fall Risks - - -  Follow up Falls evaluation completed Falls evaluation completed - - -    ADL screen:  See questionnaire below.  Functional Status Survey: Is the patient deaf or have difficulty hearing?: Yes Does the patient have difficulty seeing, even when wearing glasses/contacts?: No Does the patient have difficulty concentrating, remembering, or making decisions?: No Does the patient have difficulty walking or climbing stairs?: No Does the patient have difficulty dressing or bathing?: No Does the patient have difficulty doing errands alone such as visiting a doctor's office or shopping?: No   Review of Systems  Constitutional: -, -unexpected weight change, -anorexia, -fatigue Allergy: -sneezing, -itching, -congestion Dermatology: denies changing moles, rash, lumps ENT: -runny nose, -ear pain, -sore throat,  Cardiology:  -chest pain, -palpitations, -orthopnea, Respiratory: -cough, -shortness of breath, -dyspnea on exertion, -wheezing,  Gastroenterology: -abdominal pain, -nausea, -vomiting, -diarrhea, -constipation, -dysphagia Hematology: -bleeding or bruising problems Musculoskeletal: -arthralgias, -myalgias, -joint swelling, -back pain, - Ophthalmology: -vision changes,  Urology: -dysuria, -difficulty  urinating,  -urinary frequency, -urgency, incontinence Neurology: -, -numbness, , -memory loss, -falls, -dizziness    PHYSICAL EXAM:  BP 140/80    Pulse 82    Ht 5\' 10"  (1.778 m)    Wt 162 lb 12.8 oz (73.8 kg)    BMI 23.36 kg/m   General Appearance: Alert, cooperative, no  distress, appears stated age Head: Normocephalic, without obvious abnormality, atraumatic Eyes: PERRL, conjunctiva/corneas clear, EOM's intact,  Ears: Normal TM's and external ear canals Nose: Nares normal, mucosa normal, no drainage or sinus   tenderness Throat: Lips, mucosa, and tongue normal; teeth and gums normal Neck: Supple, no lymphadenopathy, thyroid:no enlargement/tenderness/nodules; no carotid bruit or JVD Lungs: Clear to auscultation bilaterally without wheezes, rales or ronchi; respirations unlabored Heart: Regular rate and rhythm, S1 and S2 normal, no murmur, rub or gallop Abdomen: Soft, non-tender, nondistended, normoactive bowel sounds, no masses, no hepatosplenomegaly Skin: Skin color, texture, turgor normal, no rashes or lesions Lymph nodes: Cervical, supraclavicular, and axillary nodes normal Neurologic: CNII-XII intact, normal strength, sensation and gait; reflexes 2+ and symmetric throughout   Psych: Normal mood, affect, hygiene and grooming  ASSESSMENT/PLAN: Routine general medical examination at a health care facility - Plan: CBC with Differential/Platelet, Comprehensive metabolic panel, Lipid panel  Atherosclerosis of aorta (HCC) - Plan: Lipid panel, rosuvastatin (CRESTOR) 20 MG tablet  Essential hypertension - Plan: CBC with Differential/Platelet, Comprehensive metabolic panel, losartan (COZAAR) 100 MG tablet  COPD, group B, by GOLD 2017 classification (HCC)  Presbycusis of both ears  History of colonic polyps  Hyperlipidemia, unspecified hyperlipidemia type - Plan: rosuvastatin (CRESTOR) 20 MG tablet  Smoker  Need for influenza vaccination - Plan: Flu vaccine HIGH DOSE PF (Fluzone High dose)  Need for vaccination against Streptococcus pneumoniae - Plan: Pneumococcal polysaccharide vaccine 23-valent greater than or equal to 2yo subcutaneous/IM I will increase his losartan to 100 mg. Recommend audiology evaluation and get another hearing aid as he has lost  1. Follow-up with pulmonary concerning his breathing.  Did not discuss smoking cessation as we have done this in the past with no benefit.  He continues to remain uninterested in quitting smoking.   Immunization recommendations discussed.  Recommend he get Shingrix and Tdap at the drugstore.  Colonoscopy recommendations reviewed.  He is to call [facility] to get scheduled for colonoscopy.   Medicare Attestation I have personally reviewed: The patient's medical and social history Their use of alcohol, tobacco or illicit drugs Their current medications and supplements The patient's functional ability including ADLs,fall risks, home safety risks, cognitive, and hearing and visual impairment Diet and physical activities Evidence for depression or mood disorders  The patient's weight, height, and BMI have been recorded in the chart.  I have made referrals, counseling, and provided education to the patient based on review of the above and I have provided the patient with a written personalized care plan for preventive services.     2018, MD   06/25/2021

## 2021-06-26 LAB — COMPREHENSIVE METABOLIC PANEL
ALT: 14 IU/L (ref 0–44)
AST: 20 IU/L (ref 0–40)
Albumin/Globulin Ratio: 1.6 (ref 1.2–2.2)
Albumin: 4.4 g/dL (ref 3.7–4.7)
Alkaline Phosphatase: 75 IU/L (ref 44–121)
BUN/Creatinine Ratio: 18 (ref 10–24)
BUN: 14 mg/dL (ref 8–27)
Bilirubin Total: 0.3 mg/dL (ref 0.0–1.2)
CO2: 26 mmol/L (ref 20–29)
Calcium: 9.5 mg/dL (ref 8.6–10.2)
Chloride: 95 mmol/L — ABNORMAL LOW (ref 96–106)
Creatinine, Ser: 0.78 mg/dL (ref 0.76–1.27)
Globulin, Total: 2.7 g/dL (ref 1.5–4.5)
Glucose: 84 mg/dL (ref 70–99)
Potassium: 4.7 mmol/L (ref 3.5–5.2)
Sodium: 135 mmol/L (ref 134–144)
Total Protein: 7.1 g/dL (ref 6.0–8.5)
eGFR: 95 mL/min/{1.73_m2} (ref >59)

## 2021-06-26 LAB — CBC WITH DIFFERENTIAL/PLATELET
Basophils Absolute: 0.1 x10E3/uL (ref 0.0–0.2)
Basos: 2 %
EOS (ABSOLUTE): 0.2 x10E3/uL (ref 0.0–0.4)
Eos: 3 %
Hematocrit: 45.9 % (ref 37.5–51.0)
Hemoglobin: 15.2 g/dL (ref 13.0–17.7)
Immature Grans (Abs): 0 x10E3/uL (ref 0.0–0.1)
Immature Granulocytes: 0 %
Lymphocytes Absolute: 2.2 x10E3/uL (ref 0.7–3.1)
Lymphs: 33 %
MCH: 31.7 pg (ref 26.6–33.0)
MCHC: 33.1 g/dL (ref 31.5–35.7)
MCV: 96 fL (ref 79–97)
Monocytes Absolute: 0.6 x10E3/uL (ref 0.1–0.9)
Monocytes: 9 %
Neutrophils Absolute: 3.7 x10E3/uL (ref 1.4–7.0)
Neutrophils: 53 %
Platelets: 205 x10E3/uL (ref 150–450)
RBC: 4.8 x10E6/uL (ref 4.14–5.80)
RDW: 12.7 % (ref 11.6–15.4)
WBC: 6.8 x10E3/uL (ref 3.4–10.8)

## 2021-06-26 LAB — LIPID PANEL
Chol/HDL Ratio: 1.6 ratio (ref 0.0–5.0)
Cholesterol, Total: 163 mg/dL (ref 100–199)
HDL: 99 mg/dL (ref >39)
LDL Chol Calc (NIH): 49 mg/dL (ref 0–99)
Triglycerides: 83 mg/dL (ref 0–149)
VLDL Cholesterol Cal: 15 mg/dL (ref 5–40)

## 2021-07-05 ENCOUNTER — Other Ambulatory Visit: Payer: Self-pay | Admitting: Family Medicine

## 2021-07-05 DIAGNOSIS — I10 Essential (primary) hypertension: Secondary | ICD-10-CM

## 2021-07-05 DIAGNOSIS — I700 Atherosclerosis of aorta: Secondary | ICD-10-CM

## 2021-08-03 ENCOUNTER — Other Ambulatory Visit: Payer: Self-pay | Admitting: Pulmonary Disease

## 2021-08-03 DIAGNOSIS — J449 Chronic obstructive pulmonary disease, unspecified: Secondary | ICD-10-CM

## 2021-08-04 ENCOUNTER — Encounter: Payer: Self-pay | Admitting: Pulmonary Disease

## 2021-08-04 ENCOUNTER — Other Ambulatory Visit: Payer: Self-pay

## 2021-08-04 ENCOUNTER — Ambulatory Visit: Payer: Medicare Other | Admitting: Pulmonary Disease

## 2021-08-04 ENCOUNTER — Ambulatory Visit (INDEPENDENT_AMBULATORY_CARE_PROVIDER_SITE_OTHER): Payer: Medicare Other | Admitting: Pulmonary Disease

## 2021-08-04 VITALS — BP 134/60 | HR 66 | Temp 97.9°F | Ht 67.0 in | Wt 158.8 lb

## 2021-08-04 DIAGNOSIS — J449 Chronic obstructive pulmonary disease, unspecified: Secondary | ICD-10-CM

## 2021-08-04 DIAGNOSIS — F17210 Nicotine dependence, cigarettes, uncomplicated: Secondary | ICD-10-CM

## 2021-08-04 LAB — PULMONARY FUNCTION TEST
DL/VA % pred: 25 %
DL/VA: 1.03 ml/min/mmHg/L
DLCO cor % pred: 22 %
DLCO cor: 5.16 ml/min/mmHg
DLCO unc % pred: 22 %
DLCO unc: 5.16 ml/min/mmHg
FEF 25-75 Post: 0.48 L/sec
FEF 25-75 Pre: 0.37 L/sec
FEF2575-%Change-Post: 30 %
FEF2575-%Pred-Post: 22 %
FEF2575-%Pred-Pre: 17 %
FEV1-%Change-Post: 13 %
FEV1-%Pred-Post: 35 %
FEV1-%Pred-Pre: 30 %
FEV1-Post: 0.98 L
FEV1-Pre: 0.87 L
FEV1FVC-%Change-Post: 4 %
FEV1FVC-%Pred-Pre: 51 %
FEV6-%Change-Post: 11 %
FEV6-%Pred-Post: 66 %
FEV6-%Pred-Pre: 60 %
FEV6-Post: 2.42 L
FEV6-Pre: 2.17 L
FEV6FVC-%Change-Post: 2 %
FEV6FVC-%Pred-Post: 101 %
FEV6FVC-%Pred-Pre: 99 %
FVC-%Change-Post: 8 %
FVC-%Pred-Post: 65 %
FVC-%Pred-Pre: 60 %
FVC-Post: 2.53 L
FVC-Pre: 2.32 L
Post FEV1/FVC ratio: 39 %
Post FEV6/FVC ratio: 96 %
Pre FEV1/FVC ratio: 37 %
Pre FEV6/FVC Ratio: 94 %
RV % pred: 221 %
RV: 5.14 L
TLC % pred: 123 %
TLC: 7.96 L

## 2021-08-04 MED ORDER — TRELEGY ELLIPTA 200-62.5-25 MCG/ACT IN AEPB: 1.0000 | INHALATION_SPRAY | Freq: Every day | RESPIRATORY_TRACT | 5 refills | Status: AC

## 2021-08-04 NOTE — Progress Notes (Signed)
PFT done today. 

## 2021-08-04 NOTE — Patient Instructions (Signed)
°  COPD Gold Class B, symptomatic however mMRC>2. No exacerbations. --CONTINUE Trelegy 200-62.5-25 ONE puff ONCE a day. REFILL --CONTINUE Albuterol inhaler 2 puffs every 4 hours as needed for shortness of breath or wheezing  Follow-up with me in 6 months

## 2021-08-04 NOTE — Progress Notes (Signed)
Synopsis: Referred in 05/2018 for COPD  Subjective:   PATIENT ID: John Rollins GENDER: male DOB: 1951/02/16, MRN: [id number]   HPI  Chief Complaint  Patient presents with   Follow-up    PFT review   [name] is a 71 year-old male active smoker with severe COPD who presents for follow-up.  Synopsis: 2019 - Established care with [facility] for COPD. Started on Stiolto with improvement in symptoms 2020 - No exacerbations. Uses stiolto and albuterol once daily 2022 - No exacerbations. However stepped up to Trelegy in Dec for increased symptoms  08/04/21 Since his last visit he was stepped up to Trelegy.  Previously reported using albuterol 2-3 times a day with shortness of breath and chronic cough at baseline. Not using albuterol as frequently, now every other day for shortness of breath. Continues to be an active smoker of 1 pack/day. Minimal activity since winter. Still able to do some light yardwork. Denies recent COPD exacerbations since last visit.  Social Hx Active smoker. >80 pack-years Previously [employer]  Environmental exposures: [place] veteran/[employer], exposure to agent orange  Past Medical History:  Diagnosis Date   Cataract    Colon polyps 11/2008   Dr. Helaine Chess adenoma.  repeat rec 3 years   Smoker      Outpatient Medications Prior to Visit  Medication Sig Dispense Refill   albuterol (VENTOLIN HFA) 108 (90 Base) MCG/ACT inhaler Inhale 2 puffs into the lungs every 4 (four) hours as needed for wheezing or shortness of breath. 18 g 12   losartan (COZAAR) 100 MG tablet Take 1 tablet (100 mg total) by mouth daily. 90 tablet 3   Nutritional Supplements (VITAMIN D BOOSTER PO) Take by mouth.     rosuvastatin (CRESTOR) 20 MG tablet Take 1 tablet (20 mg total) by mouth daily. 90 tablet 3   Fluticasone-Umeclidin-Vilant (TRELEGY ELLIPTA) 200-62.5-25 MCG/ACT AEPB Inhale 1 puff into the lungs daily. 60 each 5   No facility-administered medications  prior to visit.    Review of Systems  Constitutional:  Negative for chills, diaphoresis, fever, malaise/fatigue and weight loss.  HENT:  Negative for congestion.   Respiratory:  Positive for shortness of breath. Negative for cough, hemoptysis, sputum production and wheezing.   Cardiovascular:  Negative for chest pain, palpitations and leg swelling.  Objective:    Vitals:   08/04/21 1017  BP: 134/60  Pulse: 66  Temp: 97.9 F (36.6 C)  TempSrc: Oral  SpO2: 94%  Weight: 158 lb 12.8 oz (72 kg)  Height: 5\' 7"  (1.702 m)   SpO2: 94 % O2 Device: None (Room air)  Physical Exam: General: Well-appearing, no acute distress HENT: [place], AT Eyes: EOMI, no scleral icterus Respiratory: Clear to auscultation bilaterally.  No crackles, wheezing or rales Cardiovascular: RRR, -M/R/G, no JVD Extremities:-Edema,-tenderness Neuro: AAO x4, CNII-XII grossly intact Psych: Normal mood, normal affect  Data Reviewed:  Chest imaging CT chest lung screen 05/17/2016-emphysema, scattered pulmonary nodules bilaterally less than 6 mm CT Chest Lung Screen 07/04/18 - no enlarging or suspicious nodules seen CT Chest Lung Screen 10/15/20 - centrilobular and paraseptal emphysema. New RLL nodule measuring 7.7 mm CT Chest Lung Screen 01/25/21 - centrilobular and paraseptal emphysema. Resolving RLL nodule now 3.8 mm  PFT 05/09/2018-FEV1/FVC 46% FEV1 0.9 (30%) FVC 2.1 (48%)   07/12/18- FEV1/FVC 44% FEV1 1.5 (51%) FVC 3.21 (80%). Consistent with emphysema. Compared to last year's studies, significant improvement in FVC and FEV1 from very severe COPD to moderately COPD.  08/04/21 FVC 2.53 (  65%) FEV1 .98 (35%) Ratio 37  TLC 123% RV 221% DLCO 22% Interpretation: Very severe obstructive defect with air trapping and severely reduced gas exchange consistent with emphysem   CBC    Component Value Date/Time   WBC 6.8 06/25/2021 0908   WBC 10.0 09/23/2014 0001   RBC 4.80 06/25/2021 0908   RBC 4.68 09/23/2014 0001   HGB  15.2 06/25/2021 0908   HCT 45.9 06/25/2021 0908   PLT 205 06/25/2021 0908   MCV 96 06/25/2021 0908   MCH 31.7 06/25/2021 0908   MCH 31.6 09/23/2014 0001   MCHC 33.1 06/25/2021 0908   MCHC 33.7 09/23/2014 0001   RDW 12.7 06/25/2021 0908   LYMPHSABS 2.2 06/25/2021 0908   MONOABS 0.9 09/23/2014 0001   EOSABS 0.2 06/25/2021 0908   BASOSABS 0.1 06/25/2021 0908   Absolute eos 06/22/20 - 100    Assessment & Plan:   Discussion: 71 year old male active smoker (> 80 pack years) who presents for follow-up.  PFTs reviewed which demonstrates severe COPD consistent with emphysema. Has symptoms however does not feel like this limits his activity. Less frequent SABA use on triple therapy.  COPD Gold Class B, symptomatic however mMRC>2. No exacerbations. --CONTINUE Trelegy 200-62.5-25 ONE puff ONCE a day. REFILL --CONTINUE Albuterol inhaler 2 puffs every 4 hours as needed for shortness of breath or wheezing  RLL lung nodule measuring ~7.7 mm now resolving to 3.8 mm --Continue Lung cancer screening  Tobacco abuse Patient is an active smoker. Currently 1ppd Not planning to quit   Health Maintenance Immunization History  Administered Date(s) Administered   Fluad Quad(high Dose 65+) 06/18/2019, 06/22/2020   Influenza, High Dose Seasonal PF 06/25/2021   Influenza-Unspecified 03/27/2014, 04/04/2016, 06/28/2019, 05/27/2020   PFIZER(Purple Top)SARS-COV-2 Vaccination 08/02/2019, 08/23/2019, 06/22/2020   Pneumococcal Conjugate-13 06/18/2019   Pneumococcal Polysaccharide-23 06/25/2021   Tdap 06/28/2007    No orders of the defined types were placed in this encounter.   Meds ordered this encounter  Medications   Fluticasone-Umeclidin-Vilant (TRELEGY ELLIPTA) 200-62.5-25 MCG/ACT AEPB    Sig: Inhale 1 puff into the lungs daily.    Dispense:  60 each    Refill:  5    Return in about 6 months (around 02/01/2022).  I have spent a total time of 30-minutes on the day of the appointment reviewing  prior documentation, coordinating care and discussing medical diagnosis and plan with the patient/family. Past medical history, allergies, medications were reviewed. Pertinent imaging, labs and tests included in this note have been reviewed and interpreted independently by me.  Mechele Collin, MD [facility]monary/Critical [facility] Medicine 08/04/2021 10:49 AM

## 2022-01-25 ENCOUNTER — Ambulatory Visit (HOSPITAL_COMMUNITY)
Admission: RE | Admit: 2022-01-25 | Discharge: 2022-01-25 | Disposition: A | Discharge status: Home / Self Care | Payer: Medicare Other | Source: Ambulatory Visit | Attending: Family Medicine | Admitting: Family Medicine

## 2022-01-25 DIAGNOSIS — I700 Atherosclerosis of aorta: Secondary | ICD-10-CM | POA: Insufficient documentation

## 2022-01-25 DIAGNOSIS — I2510 Atherosclerotic heart disease of native coronary artery without angina pectoris: Secondary | ICD-10-CM | POA: Insufficient documentation

## 2022-01-25 DIAGNOSIS — F17210 Nicotine dependence, cigarettes, uncomplicated: Secondary | ICD-10-CM | POA: Diagnosis present

## 2022-01-25 DIAGNOSIS — J439 Emphysema, unspecified: Secondary | ICD-10-CM | POA: Diagnosis not present

## 2022-01-25 DIAGNOSIS — Z122 Encounter for screening for malignant neoplasm of respiratory organs: Secondary | ICD-10-CM | POA: Diagnosis not present

## 2022-01-25 DIAGNOSIS — Z87891 Personal history of nicotine dependence: Secondary | ICD-10-CM

## 2022-03-02 ENCOUNTER — Encounter: Payer: Self-pay | Admitting: Internal Medicine

## 2022-03-21 ENCOUNTER — Other Ambulatory Visit: Payer: Self-pay

## 2022-03-21 DIAGNOSIS — Z87891 Personal history of nicotine dependence: Secondary | ICD-10-CM

## 2022-03-21 DIAGNOSIS — F17210 Nicotine dependence, cigarettes, uncomplicated: Secondary | ICD-10-CM

## 2022-03-21 DIAGNOSIS — Z122 Encounter for screening for malignant neoplasm of respiratory organs: Secondary | ICD-10-CM

## 2022-04-01 ENCOUNTER — Other Ambulatory Visit: Payer: Self-pay | Admitting: Family Medicine

## 2022-04-01 DIAGNOSIS — I10 Essential (primary) hypertension: Secondary | ICD-10-CM

## 2022-04-05 ENCOUNTER — Encounter: Payer: Self-pay | Admitting: Internal Medicine

## 2022-04-06 ENCOUNTER — Telehealth: Payer: Self-pay | Admitting: Family Medicine

## 2022-04-06 NOTE — Telephone Encounter (Signed)
Received a call form Rodena Piety at [facility] and Berley passed away 2022-04-07 at his home and they need the death certificate signed. Provided call back number [phone].

## 2022-04-08 ENCOUNTER — Telehealth: Payer: Self-pay | Admitting: Family Medicine

## 2022-04-08 NOTE — Telephone Encounter (Signed)
[facility] called and needs the death certificate signed. His family would like to have him cremated this weekend.

## 2022-04-27 DEATH — deceased

## 2022-07-05 ENCOUNTER — Ambulatory Visit: Payer: Medicare Other | Admitting: Family Medicine

## 2023-02-12 IMAGING — CT CT CHEST LCS NODULE FOLLOW-UP W/O CM
1 of 2 series · 10 of 20 positions shown, 13 images · non-contrast
Comparison: Low-dose lung cancer screening chest CT 10/15/2020.

CLINICAL DATA: 70-year-old male current smoker with 84 pack-year
history of smoking. Follow-up for prior abnormal low-dose lung
cancer screening examination.

EXAM:
CT CHEST WITHOUT CONTRAST FOR LUNG CANCER SCREENING NODULE FOLLOW-UP
TECHNIQUE: Multidetector CT imaging of the chest was performed following the
standard protocol without IV contrast.

[ct lung segmentation data · axial · 0.80mm/px · z∈[+1368,+1368]mm · 10 of 333 frames shown]
[frame 1/333  mediastinal]
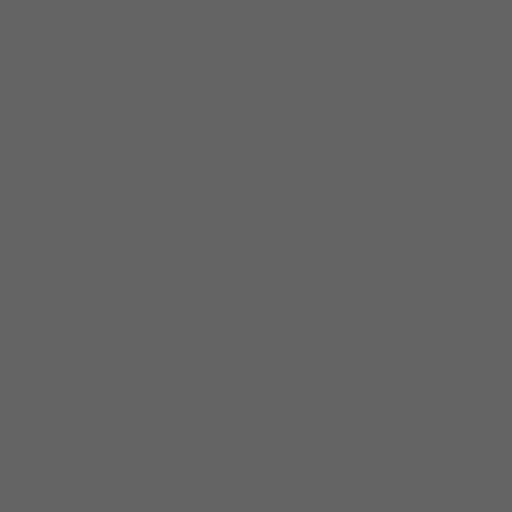
[frame 1/333  lung]
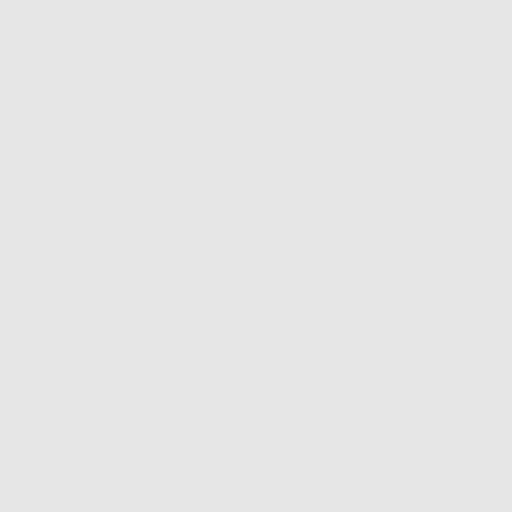
[frame 37/333  lung]
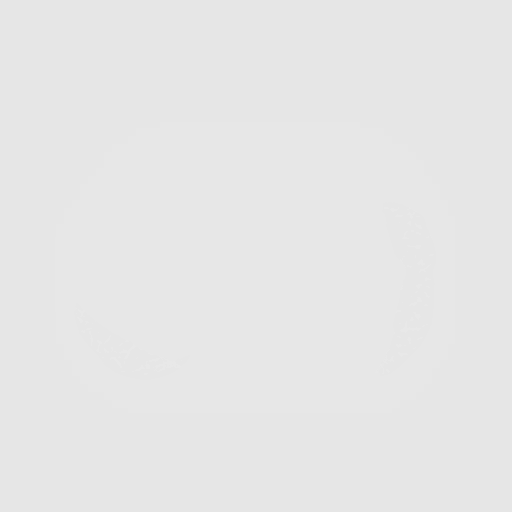
[frame 74/333  lung]
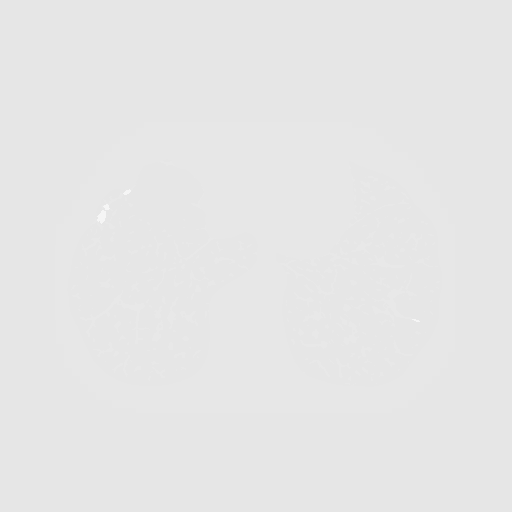
[frame 111/333  lung]
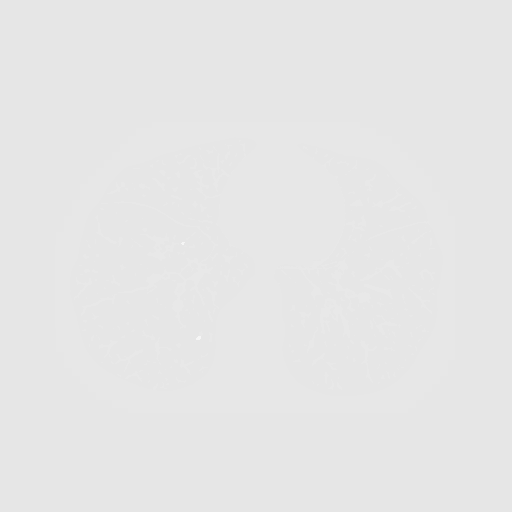
[frame 148/333  mediastinal]
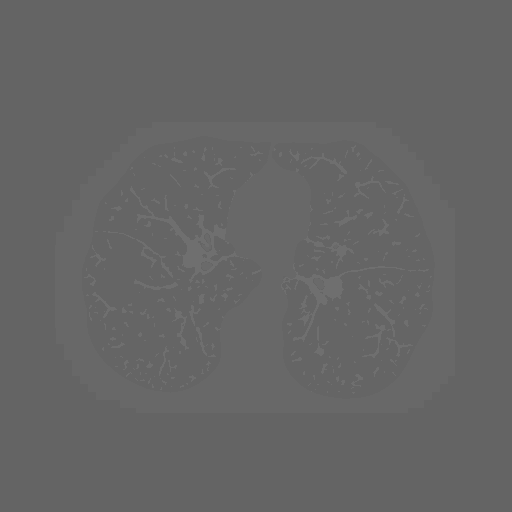
[frame 148/333  lung]
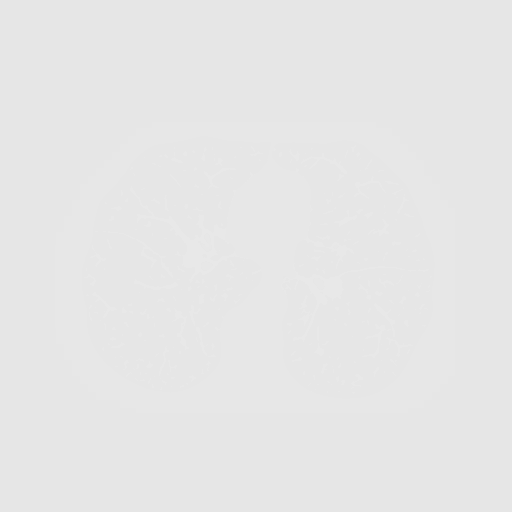
[frame 185/333  lung]
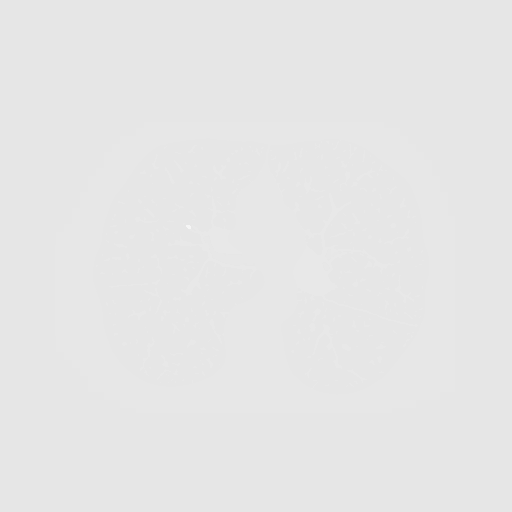
[frame 222/333  lung]
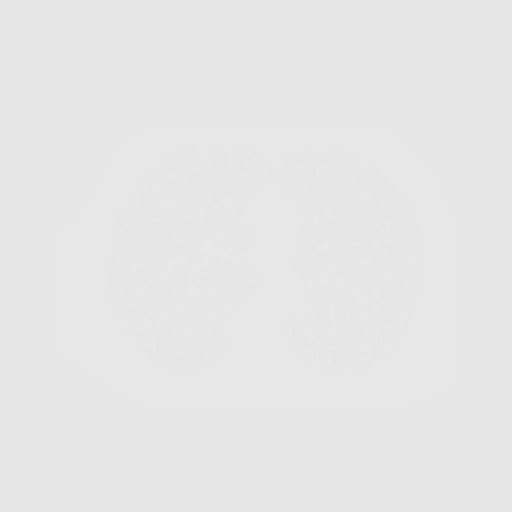
[frame 259/333  lung]
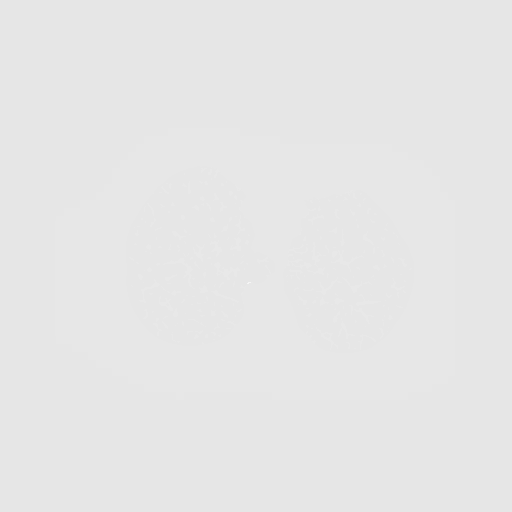
[frame 296/333  mediastinal]
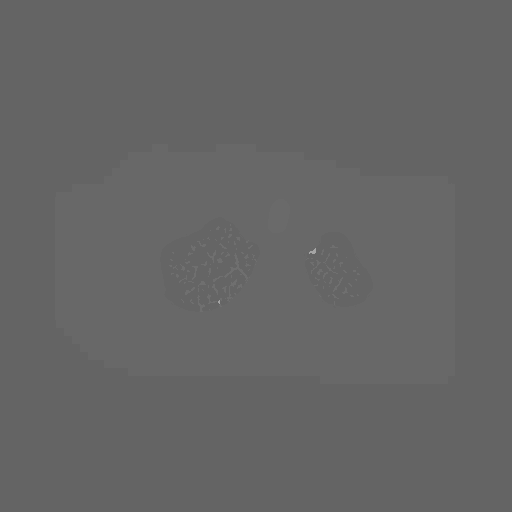
[frame 296/333  lung]
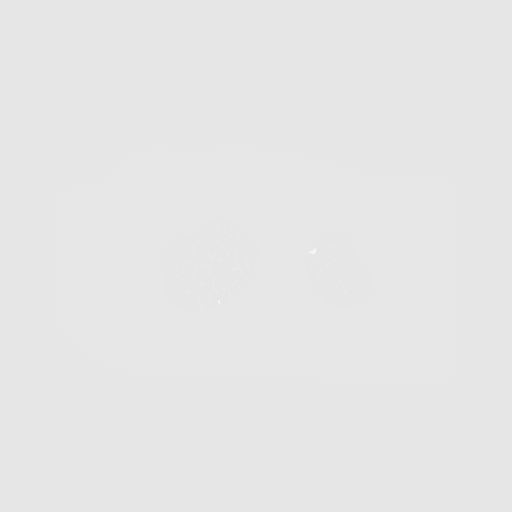
[frame 333/333  lung]
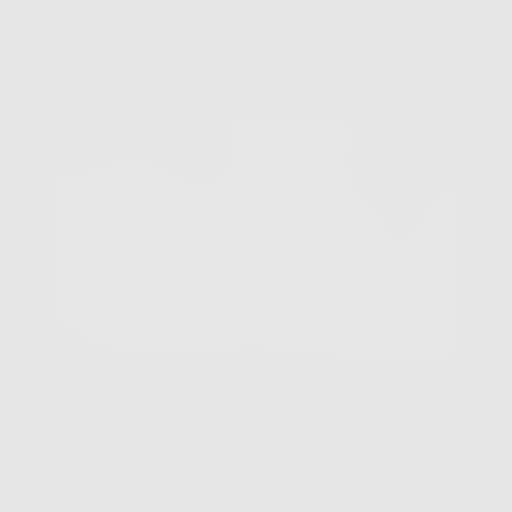

[10 of 20 positions shown; findings below may reference images not displayed]

FINDINGS: Cardiovascular: Heart size is normal. There is no significant
pericardial fluid, thickening or pericardial calcification. There is
aortic atherosclerosis, as well as atherosclerosis of the great
vessels of the mediastinum and the coronary arteries, including
calcified atherosclerotic plaque in the left main, left anterior
descending, left circumflex and right coronary arteries.
Calcifications of the aortic valve.

Mediastinum/Nodes: No pathologically enlarged mediastinal or hilar
lymph nodes. Please note that accurate exclusion of hilar adenopathy
is limited on noncontrast CT scans. Esophagus is unremarkable in
appearance. No axillary lymphadenopathy.

Lungs/Pleura: Previously noted nodule of concern in the right lower
lobe (axial image 274 of series 3) has nearly completely resolved,
and is currently only a small ground-glass attenuation nodule with a
volume derived mean diameter of 3.8 mm, indicative of a benign
infectious or inflammatory etiology. No other larger more suspicious
appearing pulmonary nodules or masses are noted. No acute
consolidative airspace disease. No pleural effusions. Diffuse
bronchial wall thickening with mild centrilobular and paraseptal
emphysema.

Upper Abdomen: Aortic atherosclerosis.

Musculoskeletal: Multiple old healed left-sided rib fractures. There
are no aggressive appearing lytic or blastic lesions noted in the
visualized portions of the skeleton.
IMPRESSION: 1. Lung-RADS 2S, benign appearance or behavior. Continue annual
screening with low-dose chest CT without contrast in 12 months.
2. The "S" modifier above refers to potentially clinically
significant non lung cancer related findings. Specifically, there is
aortic atherosclerosis, in addition to left main and 3 vessel
coronary artery disease. Please note that although the presence of
coronary artery calcium documents the presence of coronary artery
disease, the severity of this disease and any potential stenosis
cannot be assessed on this non-gated CT examination. Assessment for
potential risk factor modification, dietary therapy or pharmacologic
therapy may be warranted, if clinically indicated.
3. Mild diffuse bronchial wall thickening with mild centrilobular
and paraseptal emphysema; imaging findings suggestive of underlying
COPD.
4. There are calcifications of the aortic valve. Echocardiographic
correlation for evaluation of potential valvular dysfunction may be
warranted if clinically indicated.

Aortic Atherosclerosis (5ZEEX-428.8) and Emphysema (5ZEEX-KN8.N).
